# Patient Record
Sex: Female | Born: 1935 | Race: White | Hispanic: No | Smoking: Never smoker
Health system: Southern US, Community
[De-identification: ages and names within clinical notes are randomized; demographics above are authoritative.]

## PROBLEM LIST (undated history)

## (undated) DIAGNOSIS — K219 Gastro-esophageal reflux disease without esophagitis: Secondary | ICD-10-CM

## (undated) DIAGNOSIS — I482 Chronic atrial fibrillation, unspecified: Secondary | ICD-10-CM

## (undated) DIAGNOSIS — E785 Hyperlipidemia, unspecified: Secondary | ICD-10-CM

## (undated) DIAGNOSIS — C55 Malignant neoplasm of uterus, part unspecified: Secondary | ICD-10-CM

## (undated) DIAGNOSIS — H409 Unspecified glaucoma: Secondary | ICD-10-CM

## (undated) DIAGNOSIS — E039 Hypothyroidism, unspecified: Secondary | ICD-10-CM

## (undated) DIAGNOSIS — F418 Other specified anxiety disorders: Secondary | ICD-10-CM

## (undated) DIAGNOSIS — I2510 Atherosclerotic heart disease of native coronary artery without angina pectoris: Secondary | ICD-10-CM

## (undated) HISTORY — DX: Other specified anxiety disorders: F41.8

## (undated) HISTORY — PX: APPENDECTOMY: SHX54

## (undated) HISTORY — DX: Atherosclerotic heart disease of native coronary artery without angina pectoris: I25.10

## (undated) HISTORY — PX: TONSILLECTOMY: SUR1361

## (undated) HISTORY — DX: Malignant neoplasm of uterus, part unspecified: C55

## (undated) HISTORY — DX: Gastro-esophageal reflux disease without esophagitis: K21.9

## (undated) HISTORY — DX: Hypothyroidism, unspecified: E03.9

## (undated) HISTORY — DX: Hyperlipidemia, unspecified: E78.5

## (undated) HISTORY — DX: Chronic atrial fibrillation, unspecified: I48.20

## (undated) HISTORY — DX: Unspecified glaucoma: H40.9

---

## 1995-05-21 HISTORY — PX: BREAST EXCISIONAL BIOPSY: SUR124

## 1998-04-17 ENCOUNTER — Inpatient Hospital Stay (HOSPITAL_COMMUNITY): Admission: EM | Admit: 1998-04-17 | Discharge: 1998-04-19 | Payer: Self-pay | Admitting: Cardiology

## 1998-04-19 DIAGNOSIS — I2510 Atherosclerotic heart disease of native coronary artery without angina pectoris: Secondary | ICD-10-CM

## 2000-07-18 HISTORY — PX: TOTAL ABDOMINAL HYSTERECTOMY W/ BILATERAL SALPINGOOPHORECTOMY: SHX83

## 2000-09-25 ENCOUNTER — Encounter: Payer: Self-pay | Admitting: Internal Medicine

## 2000-09-25 ENCOUNTER — Ambulatory Visit (HOSPITAL_COMMUNITY): Admission: RE | Admit: 2000-09-25 | Discharge: 2000-09-25 | Payer: Self-pay | Admitting: Internal Medicine

## 2000-10-02 ENCOUNTER — Ambulatory Visit (HOSPITAL_COMMUNITY): Admission: RE | Admit: 2000-10-02 | Discharge: 2000-10-02 | Payer: Self-pay | Admitting: Internal Medicine

## 2000-10-02 ENCOUNTER — Encounter: Payer: Self-pay | Admitting: Internal Medicine

## 2001-06-16 ENCOUNTER — Ambulatory Visit (HOSPITAL_COMMUNITY): Admission: RE | Admit: 2001-06-16 | Discharge: 2001-06-16 | Payer: Self-pay | Admitting: Cardiology

## 2001-08-11 ENCOUNTER — Other Ambulatory Visit: Admission: RE | Admit: 2001-08-11 | Discharge: 2001-08-11 | Payer: Self-pay | Admitting: Internal Medicine

## 2001-08-28 ENCOUNTER — Inpatient Hospital Stay (HOSPITAL_COMMUNITY): Admission: EM | Admit: 2001-08-28 | Discharge: 2001-08-31 | Payer: Self-pay | Admitting: Internal Medicine

## 2001-08-28 ENCOUNTER — Encounter: Payer: Self-pay | Admitting: Internal Medicine

## 2001-10-06 ENCOUNTER — Encounter: Payer: Self-pay | Admitting: Internal Medicine

## 2001-10-06 ENCOUNTER — Ambulatory Visit (HOSPITAL_COMMUNITY): Admission: RE | Admit: 2001-10-06 | Discharge: 2001-10-06 | Payer: Self-pay | Admitting: Internal Medicine

## 2002-03-19 ENCOUNTER — Inpatient Hospital Stay (HOSPITAL_COMMUNITY): Admission: EM | Admit: 2002-03-19 | Discharge: 2002-04-01 | Payer: Self-pay | Admitting: Psychiatry

## 2002-10-21 ENCOUNTER — Ambulatory Visit (HOSPITAL_COMMUNITY): Admission: RE | Admit: 2002-10-21 | Discharge: 2002-10-21 | Payer: Self-pay | Admitting: Internal Medicine

## 2002-10-21 ENCOUNTER — Encounter: Payer: Self-pay | Admitting: Internal Medicine

## 2004-01-03 ENCOUNTER — Ambulatory Visit (HOSPITAL_COMMUNITY): Admission: RE | Admit: 2004-01-03 | Discharge: 2004-01-03 | Payer: Self-pay | Admitting: Internal Medicine

## 2004-04-03 ENCOUNTER — Ambulatory Visit: Payer: Self-pay | Admitting: *Deleted

## 2004-05-08 ENCOUNTER — Ambulatory Visit: Payer: Self-pay | Admitting: *Deleted

## 2004-05-22 ENCOUNTER — Ambulatory Visit: Payer: Self-pay | Admitting: Internal Medicine

## 2004-06-01 ENCOUNTER — Ambulatory Visit: Payer: Self-pay | Admitting: *Deleted

## 2004-06-28 ENCOUNTER — Ambulatory Visit: Payer: Self-pay | Admitting: *Deleted

## 2004-07-26 ENCOUNTER — Ambulatory Visit: Payer: Self-pay | Admitting: *Deleted

## 2004-08-23 ENCOUNTER — Ambulatory Visit: Payer: Self-pay | Admitting: Cardiology

## 2004-09-19 ENCOUNTER — Ambulatory Visit: Payer: Self-pay | Admitting: *Deleted

## 2004-10-22 ENCOUNTER — Ambulatory Visit: Payer: Self-pay | Admitting: Cardiology

## 2004-11-19 ENCOUNTER — Ambulatory Visit: Payer: Self-pay | Admitting: Cardiology

## 2004-12-18 ENCOUNTER — Ambulatory Visit: Payer: Self-pay | Admitting: *Deleted

## 2005-01-03 ENCOUNTER — Ambulatory Visit (HOSPITAL_COMMUNITY): Admission: RE | Admit: 2005-01-03 | Discharge: 2005-01-03 | Payer: Self-pay | Admitting: Internal Medicine

## 2005-01-11 ENCOUNTER — Ambulatory Visit: Payer: Self-pay | Admitting: *Deleted

## 2005-01-29 ENCOUNTER — Ambulatory Visit: Payer: Self-pay | Admitting: *Deleted

## 2005-03-07 ENCOUNTER — Ambulatory Visit: Payer: Self-pay | Admitting: Cardiology

## 2005-04-08 ENCOUNTER — Ambulatory Visit: Payer: Self-pay | Admitting: Cardiology

## 2005-05-07 ENCOUNTER — Ambulatory Visit: Payer: Self-pay | Admitting: Cardiology

## 2005-06-24 ENCOUNTER — Ambulatory Visit: Payer: Self-pay | Admitting: *Deleted

## 2005-07-11 ENCOUNTER — Ambulatory Visit: Payer: Self-pay | Admitting: Cardiology

## 2005-08-08 ENCOUNTER — Ambulatory Visit: Payer: Self-pay | Admitting: *Deleted

## 2005-09-12 ENCOUNTER — Ambulatory Visit: Payer: Self-pay | Admitting: Cardiology

## 2005-09-24 ENCOUNTER — Ambulatory Visit: Payer: Self-pay | Admitting: Cardiology

## 2005-10-29 ENCOUNTER — Ambulatory Visit: Payer: Self-pay | Admitting: *Deleted

## 2005-11-27 ENCOUNTER — Ambulatory Visit: Payer: Self-pay | Admitting: Internal Medicine

## 2005-11-27 ENCOUNTER — Ambulatory Visit: Payer: Self-pay | Admitting: Cardiology

## 2005-12-25 ENCOUNTER — Ambulatory Visit: Payer: Self-pay | Admitting: Internal Medicine

## 2005-12-25 ENCOUNTER — Other Ambulatory Visit: Admission: RE | Admit: 2005-12-25 | Discharge: 2005-12-25 | Payer: Self-pay | Admitting: Internal Medicine

## 2005-12-31 ENCOUNTER — Ambulatory Visit: Payer: Self-pay | Admitting: *Deleted

## 2006-01-07 ENCOUNTER — Ambulatory Visit (HOSPITAL_COMMUNITY): Admission: RE | Admit: 2006-01-07 | Discharge: 2006-01-07 | Payer: Self-pay | Admitting: Internal Medicine

## 2006-02-03 ENCOUNTER — Ambulatory Visit: Payer: Self-pay | Admitting: Cardiology

## 2006-03-06 ENCOUNTER — Ambulatory Visit: Payer: Self-pay | Admitting: Cardiology

## 2006-04-08 ENCOUNTER — Ambulatory Visit: Payer: Self-pay | Admitting: Cardiology

## 2006-05-06 ENCOUNTER — Ambulatory Visit: Payer: Self-pay | Admitting: Cardiology

## 2006-05-22 ENCOUNTER — Ambulatory Visit: Payer: Self-pay | Admitting: Cardiology

## 2006-06-24 ENCOUNTER — Ambulatory Visit: Payer: Self-pay | Admitting: Cardiology

## 2006-07-22 ENCOUNTER — Ambulatory Visit: Payer: Self-pay | Admitting: Cardiology

## 2006-08-19 ENCOUNTER — Ambulatory Visit: Payer: Self-pay | Admitting: Cardiovascular Disease

## 2006-09-23 ENCOUNTER — Ambulatory Visit: Payer: Self-pay | Admitting: Cardiology

## 2006-10-21 ENCOUNTER — Ambulatory Visit: Payer: Self-pay | Admitting: Cardiology

## 2006-11-18 ENCOUNTER — Ambulatory Visit: Payer: Self-pay | Admitting: Cardiovascular Disease

## 2006-12-23 ENCOUNTER — Ambulatory Visit: Payer: Self-pay | Admitting: Cardiology

## 2007-01-06 ENCOUNTER — Ambulatory Visit: Payer: Self-pay | Admitting: Cardiovascular Disease

## 2007-01-12 ENCOUNTER — Ambulatory Visit (HOSPITAL_COMMUNITY): Admission: RE | Admit: 2007-01-12 | Discharge: 2007-01-12 | Payer: Self-pay | Admitting: Internal Medicine

## 2007-01-23 ENCOUNTER — Ambulatory Visit (HOSPITAL_COMMUNITY): Admission: RE | Admit: 2007-01-23 | Discharge: 2007-01-23 | Payer: Self-pay | Admitting: Internal Medicine

## 2007-01-23 ENCOUNTER — Ambulatory Visit: Payer: Self-pay | Admitting: Internal Medicine

## 2007-01-23 ENCOUNTER — Encounter: Payer: Self-pay | Admitting: Internal Medicine

## 2007-01-23 HISTORY — PX: COLONOSCOPY: SHX174

## 2007-01-27 ENCOUNTER — Ambulatory Visit: Payer: Self-pay | Admitting: Cardiology

## 2007-02-09 ENCOUNTER — Ambulatory Visit: Payer: Self-pay | Admitting: Cardiology

## 2007-03-10 ENCOUNTER — Ambulatory Visit: Payer: Self-pay | Admitting: Cardiology

## 2007-04-07 ENCOUNTER — Ambulatory Visit: Payer: Self-pay | Admitting: Cardiology

## 2007-05-04 ENCOUNTER — Ambulatory Visit: Payer: Self-pay | Admitting: Cardiology

## 2007-05-25 ENCOUNTER — Ambulatory Visit: Payer: Self-pay | Admitting: Cardiology

## 2007-06-08 ENCOUNTER — Ambulatory Visit: Payer: Self-pay | Admitting: Cardiology

## 2007-06-29 ENCOUNTER — Ambulatory Visit: Payer: Self-pay | Admitting: Internal Medicine

## 2007-07-27 ENCOUNTER — Ambulatory Visit: Payer: Self-pay | Admitting: Internal Medicine

## 2007-08-11 ENCOUNTER — Ambulatory Visit: Payer: Self-pay | Admitting: Cardiology

## 2007-09-07 ENCOUNTER — Ambulatory Visit: Payer: Self-pay | Admitting: Cardiology

## 2007-10-06 ENCOUNTER — Ambulatory Visit: Payer: Self-pay | Admitting: Cardiology

## 2007-11-04 ENCOUNTER — Ambulatory Visit: Payer: Self-pay | Admitting: Cardiology

## 2007-12-03 ENCOUNTER — Ambulatory Visit: Payer: Self-pay | Admitting: Cardiology

## 2007-12-17 ENCOUNTER — Ambulatory Visit: Payer: Self-pay | Admitting: Cardiology

## 2007-12-31 ENCOUNTER — Ambulatory Visit: Payer: Self-pay | Admitting: Cardiology

## 2008-01-07 ENCOUNTER — Ambulatory Visit: Payer: Self-pay | Admitting: Cardiology

## 2008-01-19 ENCOUNTER — Ambulatory Visit (HOSPITAL_COMMUNITY): Admission: RE | Admit: 2008-01-19 | Discharge: 2008-01-19 | Payer: Self-pay | Admitting: Internal Medicine

## 2008-01-21 ENCOUNTER — Ambulatory Visit: Payer: Self-pay | Admitting: Cardiology

## 2008-02-25 ENCOUNTER — Ambulatory Visit: Payer: Self-pay | Admitting: Cardiology

## 2008-03-31 ENCOUNTER — Ambulatory Visit: Payer: Self-pay | Admitting: Cardiology

## 2008-04-28 ENCOUNTER — Ambulatory Visit: Payer: Self-pay | Admitting: Cardiology

## 2008-05-23 ENCOUNTER — Ambulatory Visit: Payer: Self-pay | Admitting: Cardiology

## 2008-06-06 ENCOUNTER — Ambulatory Visit: Payer: Self-pay | Admitting: Cardiology

## 2008-06-13 ENCOUNTER — Ambulatory Visit (HOSPITAL_COMMUNITY): Payer: Self-pay | Admitting: Psychology

## 2008-06-20 ENCOUNTER — Ambulatory Visit: Payer: Self-pay | Admitting: Cardiology

## 2008-07-04 ENCOUNTER — Ambulatory Visit: Payer: Self-pay | Admitting: Cardiology

## 2008-07-28 ENCOUNTER — Ambulatory Visit: Payer: Self-pay | Admitting: Cardiology

## 2008-09-01 ENCOUNTER — Ambulatory Visit: Payer: Self-pay | Admitting: Cardiology

## 2008-09-29 ENCOUNTER — Ambulatory Visit: Payer: Self-pay | Admitting: Cardiology

## 2008-11-03 ENCOUNTER — Ambulatory Visit: Payer: Self-pay | Admitting: Cardiology

## 2008-12-05 ENCOUNTER — Ambulatory Visit: Payer: Self-pay | Admitting: Cardiology

## 2009-01-02 ENCOUNTER — Encounter: Payer: Self-pay | Admitting: *Deleted

## 2009-01-09 ENCOUNTER — Ambulatory Visit: Payer: Self-pay | Admitting: Cardiology

## 2009-01-11 ENCOUNTER — Encounter (INDEPENDENT_AMBULATORY_CARE_PROVIDER_SITE_OTHER): Payer: Self-pay | Admitting: *Deleted

## 2009-01-11 LAB — CONVERTED CEMR LAB
ALT: 15 units/L
AST: 23 units/L
Albumin: 4.1 g/dL
BUN: 16 mg/dL
CO2: 24 meq/L
Calcium: 9.4 mg/dL
Glucose, Bld: 98 mg/dL
MCV: 91.3 fL
Nitrite: NEGATIVE
Platelets: 180 10*3/uL
Sodium: 143 meq/L
Total Protein: 7.1 g/dL
Triglycerides: 214 mg/dL
WBC: 6.2 10*3/uL
pH: 6.5

## 2009-01-16 ENCOUNTER — Encounter: Payer: Self-pay | Admitting: Cardiology

## 2009-01-30 ENCOUNTER — Encounter (INDEPENDENT_AMBULATORY_CARE_PROVIDER_SITE_OTHER): Payer: Self-pay | Admitting: *Deleted

## 2009-01-30 ENCOUNTER — Ambulatory Visit (HOSPITAL_COMMUNITY): Admission: RE | Admit: 2009-01-30 | Discharge: 2009-01-30 | Payer: Self-pay | Admitting: Internal Medicine

## 2009-02-06 ENCOUNTER — Ambulatory Visit: Payer: Self-pay | Admitting: Cardiology

## 2009-02-06 LAB — CONVERTED CEMR LAB: POC INR: 2.6

## 2009-03-06 ENCOUNTER — Ambulatory Visit: Payer: Self-pay | Admitting: Cardiology

## 2009-03-06 LAB — CONVERTED CEMR LAB: POC INR: 3.2

## 2009-04-03 ENCOUNTER — Ambulatory Visit: Payer: Self-pay | Admitting: Cardiology

## 2009-05-01 ENCOUNTER — Ambulatory Visit: Payer: Self-pay | Admitting: Cardiology

## 2009-05-01 LAB — CONVERTED CEMR LAB: POC INR: 2.9

## 2009-05-31 ENCOUNTER — Telehealth (INDEPENDENT_AMBULATORY_CARE_PROVIDER_SITE_OTHER): Payer: Self-pay

## 2009-06-05 ENCOUNTER — Ambulatory Visit: Payer: Self-pay | Admitting: Cardiology

## 2009-07-03 ENCOUNTER — Ambulatory Visit: Payer: Self-pay | Admitting: Cardiology

## 2009-07-03 LAB — CONVERTED CEMR LAB: POC INR: 2.8

## 2009-07-17 ENCOUNTER — Ambulatory Visit: Payer: Self-pay | Admitting: Cardiology

## 2009-07-17 DIAGNOSIS — Z8542 Personal history of malignant neoplasm of other parts of uterus: Secondary | ICD-10-CM

## 2009-07-17 DIAGNOSIS — D126 Benign neoplasm of colon, unspecified: Secondary | ICD-10-CM

## 2009-07-17 DIAGNOSIS — K5730 Diverticulosis of large intestine without perforation or abscess without bleeding: Secondary | ICD-10-CM | POA: Insufficient documentation

## 2009-07-17 DIAGNOSIS — E785 Hyperlipidemia, unspecified: Secondary | ICD-10-CM | POA: Insufficient documentation

## 2009-08-02 ENCOUNTER — Ambulatory Visit: Payer: Self-pay | Admitting: Cardiology

## 2009-09-04 ENCOUNTER — Ambulatory Visit: Payer: Self-pay | Admitting: Cardiovascular Disease

## 2009-09-04 LAB — CONVERTED CEMR LAB: POC INR: 2.6

## 2009-09-20 ENCOUNTER — Encounter (INDEPENDENT_AMBULATORY_CARE_PROVIDER_SITE_OTHER): Payer: Self-pay | Admitting: *Deleted

## 2009-10-02 ENCOUNTER — Ambulatory Visit: Payer: Self-pay | Admitting: Cardiology

## 2009-10-02 LAB — CONVERTED CEMR LAB: POC INR: 2.2

## 2009-11-06 ENCOUNTER — Ambulatory Visit: Payer: Self-pay | Admitting: Cardiovascular Disease

## 2009-11-06 LAB — CONVERTED CEMR LAB: POC INR: 2.1

## 2009-12-11 ENCOUNTER — Ambulatory Visit: Payer: Self-pay | Admitting: Cardiology

## 2010-01-08 ENCOUNTER — Ambulatory Visit: Payer: Self-pay | Admitting: Cardiology

## 2010-01-08 LAB — CONVERTED CEMR LAB: POC INR: 2.3

## 2010-01-31 ENCOUNTER — Encounter (INDEPENDENT_AMBULATORY_CARE_PROVIDER_SITE_OTHER): Payer: Self-pay

## 2010-01-31 LAB — CONVERTED CEMR LAB
Bacteria, UA: NONE SEEN
Chloride: 108 meq/L
Cholesterol: 220 mg/dL
Glucose, Bld: 94 mg/dL
HDL: 42 mg/dL
Hemoglobin: 13.6 g/dL
RBC / HPF: 3.6
TSH: 1.738 microintl units/mL
Urine Glucose: NEGATIVE mg/dL
WBC number, urine, microscopy: 7.1 /hpf
WBC, UA: 7.1 cells/hpf
pH: 5.5

## 2010-02-05 ENCOUNTER — Ambulatory Visit: Payer: Self-pay | Admitting: Cardiology

## 2010-02-05 ENCOUNTER — Ambulatory Visit (HOSPITAL_COMMUNITY): Admission: RE | Admit: 2010-02-05 | Discharge: 2010-02-05 | Payer: Self-pay | Admitting: Internal Medicine

## 2010-02-05 LAB — CONVERTED CEMR LAB: POC INR: 2.8

## 2010-03-05 ENCOUNTER — Ambulatory Visit: Payer: Self-pay | Admitting: Cardiology

## 2010-03-05 LAB — CONVERTED CEMR LAB: POC INR: 2.2

## 2010-04-02 ENCOUNTER — Ambulatory Visit: Payer: Self-pay | Admitting: Cardiology

## 2010-04-02 LAB — CONVERTED CEMR LAB: POC INR: 2.7

## 2010-04-30 ENCOUNTER — Ambulatory Visit: Payer: Self-pay | Admitting: Cardiology

## 2010-04-30 LAB — CONVERTED CEMR LAB: POC INR: 3.8

## 2010-05-28 ENCOUNTER — Ambulatory Visit: Admission: RE | Admit: 2010-05-28 | Discharge: 2010-05-28 | Payer: Self-pay | Source: Home / Self Care

## 2010-06-05 ENCOUNTER — Encounter: Payer: Self-pay | Admitting: Cardiology

## 2010-06-19 NOTE — Medication Information (Signed)
Summary: ccr-lr  Anticoagulant Therapy  Managed by: Vashti Hey, RN Supervising MD: Dietrich Pates MD, Molly Maduro Indication 1: Atrial Fibrillation (ICD-427.31) Lab Used: [facility] [place] Site: [place] INR POC 2.8  Dietary changes: no    Health status changes: no    Bleeding/hemorrhagic complications: no    Recent/future hospitalizations: no    Any changes in medication regimen? no    Recent/future dental: no  Any missed doses?: no       Is patient compliant with meds? yes       Anticoagulation Management History:      The patient is taking warfarin and comes in today for a routine follow up visit.  Positive risk factors for bleeding include an age of 75 years or older.  The bleeding index is 'intermediate risk'.  Positive CHADS2 values include Age > 75 years old.  The start date was 09/17/2001.  Anticoagulation responsible provider: Dietrich Pates MD, Molly Maduro.  INR POC: 2.8.  Cuvette Lot#: [id number].  Exp: 10/11.    Anticoagulation Management Assessment/Plan:      The patient's current anticoagulation dose is Coumadin 4 mg tabs: Take as directed by [facility] Clinic..  The target INR is 2 - 3.  The next INR is due 07/31/2009.  Anticoagulation instructions were given to patient.  Results were reviewed/authorized by Vashti Hey, RN.  She was notified by Vashti Hey RN.         Prior Anticoagulation Instructions: INR 2.3 Continue coumadin 6mg  once daily except 4mg  on Mondays, Wednesdays and Friday  Current Anticoagulation Instructions: INR 2.8 Continue coumadin 6mg  once daily except 4mg  on Mondays, Wednesdays and Fridays

## 2010-06-19 NOTE — Medication Information (Signed)
Summary: ccr-lr  Anticoagulant Therapy  Managed by: Vashti Hey, RN PCP: Gala Lewandowsky Scharlene Corn MD: Diona Browner MD, Remi Deter Indication 1: Atrial Fibrillation (ICD-427.31) Lab Used: [facility] [place] Site: [place] INR POC 2.2  Dietary changes: no    Health status changes: no    Bleeding/hemorrhagic complications: no    Recent/future hospitalizations: no    Any changes in medication regimen? no    Recent/future dental: no  Any missed doses?: no       Is patient compliant with meds? yes       Allergies: 1)  ! Codeine  Anticoagulation Management History:      The patient is taking warfarin and comes in today for a routine follow up visit.  Positive risk factors for bleeding include an age of 32 years or older.  The bleeding index is 'intermediate risk'.  Positive CHADS2 values include History of HTN and Age > 33 years old.  The start date was 09/17/2001.  Anticoagulation responsible provider: Diona Browner MD, Remi Deter.  INR POC: 2.2.  Cuvette Lot#: [id number].  Exp: 12/2010.    Anticoagulation Management Assessment/Plan:      The patient's current anticoagulation dose is Coumadin 4 mg tabs: Take as directed by [facility] Clinic..  The target INR is 2 - 3.  The next INR is due 04/02/2010.  Anticoagulation instructions were given to patient.  Results were reviewed/authorized by Vashti Hey, RN.  She was notified by Vashti Hey RN.         Prior Anticoagulation Instructions: INR 2.8 Continue coumadin 6mg  once daily except 4mg  on Mondays, Wednesdays and Fridays  Current Anticoagulation Instructions: INR 2.2 Continue coumadin 6mg  once daily except 4mg  on M,W,F

## 2010-06-19 NOTE — Miscellaneous (Signed)
Summary: metoprolol refill  Clinical Lists Changes  Medications: Changed medication from TOPROL XL 50 MG XR24H-TAB (METOPROLOL SUCCINATE) take 1 1/2 tabs daily to METOPROLOL SUCCINATE 50 MG XR24H-TAB (METOPROLOL SUCCINATE) Take 1  1/2  tablets  by mouth daily - Signed Rx of METOPROLOL SUCCINATE 50 MG XR24H-TAB (METOPROLOL SUCCINATE) Take 1  1/2  tablets  by mouth daily;  #135 x 6;  Signed;  Entered by: Teressa Lower RN;  Authorized by: Kathlen Brunswick, MD, [facility];  Method used: Electronically to [facility] [address] [place], [place], [place]  [address], Ph: [phone], Fax: [phone]    Prescriptions: METOPROLOL SUCCINATE 50 MG XR24H-TAB (METOPROLOL SUCCINATE) Take 1  1/2  tablets  by mouth daily  #135 x 6   Entered by:   Teressa Lower RN   Authorized by:   Kathlen Brunswick, MD, [facility]   Signed by:   Teressa Lower RN on 09/20/2009   Method used:   Electronically to        [facility]  [address] (retail)       [address]       [place], [place]  [address]       Ph: [phone]       Fax: [phone]   RxID:   [id number]

## 2010-06-19 NOTE — Medication Information (Signed)
Summary: ccr-lr  Anticoagulant Therapy  Managed by: Vashti Hey, RN PCP: Osborne Casco Supervising MD: Dietrich Pates MD, Molly Maduro Indication 1: Atrial Fibrillation (ICD-427.31) Lab Used: [facility] [place] Site: [place] INR POC 2.2  Dietary changes: no    Health status changes: no    Bleeding/hemorrhagic complications: no    Recent/future hospitalizations: no    Any changes in medication regimen? no    Recent/future dental: no  Any missed doses?: no       Is patient compliant with meds? yes       Allergies: 1)  ! Codeine  Anticoagulation Management History:      The patient is taking warfarin and comes in today for a routine follow up visit.  Positive risk factors for bleeding include an age of 75 years or older.  The bleeding index is 'intermediate risk'.  Positive CHADS2 values include History of HTN and Age > 75 years old.  The start date was 09/17/2001.  Anticoagulation responsible provider: Dietrich Pates MD, Molly Maduro.  INR POC: 2.2.  Cuvette Lot#: [id number].  Exp: 10/11.    Anticoagulation Management Assessment/Plan:      The patient's current anticoagulation dose is Coumadin 4 mg tabs: Take as directed by [facility] Clinic..  The target INR is 2 - 3.  The next INR is due 10/30/2009.  Anticoagulation instructions were given to patient.  Results were reviewed/authorized by Vashti Hey, RN.  She was notified by Vashti Hey RN.         Prior Anticoagulation Instructions: INR 2.6 Continue coumadin 6mg  once daily except 4mg  on Mondays, Wednesdays and Fridays  Current Anticoagulation Instructions: INR 2.2 Continue coumadin 6mg  once daily except 4mg  on Mondays, Wednesdays and Fridays Prescriptions: COUMADIN 4 MG TABS (WARFARIN SODIUM) Take as directed by Cou[facility]in Clinic.  #135 x 2   Entered by:   Vashti Hey RN   Authorized by:   Kathlen Brunswick, MD, [facility]   Signed by:   Vashti Hey RN on 10/02/2009   Method used:   Electronically to        [facility]  [address]  (retail)       [address]       [place], [place]  [address]       Ph: [phone]       Fax: [phone]   RxID:   [id number]

## 2010-06-19 NOTE — Medication Information (Signed)
Summary: CCR  Anticoagulant Therapy  Managed by: Vashti Hey, RN Supervising MD: Dietrich Pates MD, Molly Maduro Indication 1: Atrial Fibrillation (ICD-427.31) Lab Used: [facility] [place] Site: [place] INR POC 2.3  Dietary changes: no    Health status changes: no    Bleeding/hemorrhagic complications: no    Recent/future hospitalizations: no    Any changes in medication regimen? no    Recent/future dental: no  Any missed doses?: no       Is patient compliant with meds? yes       Anticoagulation Management History:      The patient is taking warfarin and comes in today for a routine follow up visit.  Positive risk factors for bleeding include an age of 86 years or older.  The bleeding index is 'intermediate risk'.  Positive CHADS2 values include Age > 69 years old.  The start date was 09/17/2001.  Anticoagulation responsible provider: Dietrich Pates MD, Molly Maduro.  INR POC: 2.3.  Cuvette Lot#: [id number].  Exp: 10/11.    Anticoagulation Management Assessment/Plan:      The patient's current anticoagulation dose is Coumadin 4 mg tabs: Take as directed by Coumadin Clinic..  The target INR is 2 - 3.  The next INR is due 07/03/2009.  Anticoagulation instructions were given to patient.  Results were reviewed/authorized by Vashti Hey, RN.  She was notified by Vashti Hey RN.         Prior Anticoagulation Instructions: INR 2.9 Continue coumadin 6mg  once daily except 4mg  on Mondays, Wednesdays and Fridays  Current Anticoagulation Instructions: INR 2.3 Continue coumadin 6mg  once daily except 4mg  on Mondays, Wednesdays and Friday

## 2010-06-19 NOTE — Miscellaneous (Signed)
**Note De-Identified  Obfuscation** Summary: UA, CBC, CMP, Lipids, and TSH  Clinical Lists Changes  Observations: Added new observation of LDL: 154 mg/dL (54/00/8676 19:50) Added new observation of HDL: 42 mg/dL (93/26/7124 58:09) Added new observation of TRIGLYC TOT: 119 mg/dL (98/33/8250 53:97) Added new observation of CHOLESTEROL: 220 mg/dL (67/34/1937 90:24) Added new observation of TSH: 1.738 microintl units/mL (01/31/2010 13:25) Added new observation of BACTERIA URN: none seen  (01/31/2010 13:25) Added new observation of RBCS MICRO U: 3.6  (01/31/2010 13:25) Added new observation of WBCS MICRO U: 7.10 cells/hpf (01/31/2010 13:25) Added new observation of CREATININE: 1.02 mg/dL (09/73/5329 92:42) Added new observation of BUN: 23 mg/dL (68/34/1962 22:97) Added new observation of BG RANDOM: 94 mg/dL (98/92/1194 17:40) Added new observation of CO2 PLSM/SER: 24 meq/L (01/31/2010 13:25) Added new observation of CL SERUM: 108 meq/L (01/31/2010 13:25) Added new observation of K SERUM: 3.7 meq/L (01/31/2010 13:25) Added new observation of NA: 141 meq/L (01/31/2010 13:25) Added new observation of PLATELETK/UL: 196 K/uL (01/31/2010 13:25) Added new observation of MCV: 91.7 fL (01/31/2010 13:25) Added new observation of HCT: 39.9 % (01/31/2010 13:25) Added new observation of HGB: 13.6 g/dL (81/44/8185 63:14) Added new observation of WBC COUNT: 6.1 10*3/microliter (01/31/2010 13:25) Added new observation of CASTS URINE: none seen /lpf (01/31/2010 13:25) Added new observation of WBC UR: 7.10 /hpf (01/31/2010 13:25) Added new observation of NITRITE URN: neg  (01/31/2010 13:25) Added new observation of PROTEIN UR: neg mg/dL (97/06/6376 58:85) Added new observation of BLOOD UR: neg  (01/31/2010 13:25) Added new observation of GLUCOSE UA: neg mg/dL (02/77/4128 78:67) Added new observation of PH URINE: 5.5  (01/31/2010 13:25) Added new observation of SPEC GR URIN: 1.018  (01/31/2010 13:25)

## 2010-06-19 NOTE — Medication Information (Signed)
Summary: ccr-lr  Anticoagulant Therapy  Managed by: Vashti Hey, RN PCP: Osborne Casco Supervising MD: Eden Emms MD, Theron Arista Indication 1: Atrial Fibrillation (ICD-427.31) Lab Used: [facility] [place] Site: [place] INR POC 2.6  Dietary changes: no    Health status changes: no    Bleeding/hemorrhagic complications: no    Recent/future hospitalizations: no    Any changes in medication regimen? no    Recent/future dental: no  Any missed doses?: no       Is patient compliant with meds? yes       Allergies: 1)  ! Codeine  Anticoagulation Management History:      The patient is taking warfarin and comes in today for a routine follow up visit.  Positive risk factors for bleeding include an age of 75 years or older.  The bleeding index is 'intermediate risk'.  Positive CHADS2 values include History of HTN and Age > 3 years old.  The start date was 09/17/2001.  Anticoagulation responsible provider: Eden Emms MD, Theron Arista.  INR POC: 2.6.  Cuvette Lot#: [id number].  Exp: 10/11.    Anticoagulation Management Assessment/Plan:      The patient's current anticoagulation dose is Coumadin 4 mg tabs: Take as directed by Coumadin Clinic..  The target INR is 2 - 3.  The next INR is due 10/02/2009.  Anticoagulation instructions were given to patient.  Results were reviewed/authorized by Vashti Hey, RN.  She was notified by Vashti Hey RN.         Prior Anticoagulation Instructions: INR 2.8 Continue coumadin 6mg  once daily except 4mg  on Mondays, Wednesdays and Fridays  Current Anticoagulation Instructions: INR 2.6 Continue coumadin 6mg  once daily except 4mg  on Mondays, Wednesdays and Fridays

## 2010-06-19 NOTE — Medication Information (Signed)
Summary: ccr-lr  Anticoagulant Therapy  Managed by: Weston Brass, PharmD PCP: Osborne Casco Supervising MD: Eden Emms MD, Theron Arista Indication 1: Atrial Fibrillation (ICD-427.31) Lab Used: [facility]  Site: [place] INR POC 2.1  Dietary changes: no    Health status changes: no    Bleeding/hemorrhagic complications: no    Recent/future hospitalizations: no    Any changes in medication regimen? no    Recent/future dental: no  Any missed doses?: no       Is patient compliant with meds? yes       Allergies: 1)  ! Codeine  Anticoagulation Management History:      The patient is taking warfarin and comes in today for a routine follow up visit.  Positive risk factors for bleeding include an age of 75 years or older.  The bleeding index is 'intermediate risk'.  Positive CHADS2 values include History of HTN and Age > 75 years old.  The start date was 09/17/2001.  Anticoagulation responsible provider: Eden Emms MD, Theron Arista.  INR POC: 2.1.  Cuvette Lot#: [id number].  Exp: 12/2010.    Anticoagulation Management Assessment/Plan:      The patient's current anticoagulation dose is Coumadin 4 mg tabs: Take as directed by Coumadin Clinic..  The target INR is 2 - 3.  The next INR is due 12/11/2009.  Anticoagulation instructions were given to patient.  Results were reviewed/authorized by Weston Brass, PharmD.  She was notified by Weston Brass PharmD.         Prior Anticoagulation Instructions: INR 2.2 Continue coumadin 6mg  once daily except 4mg  on Mondays, Wednesdays and Fridays  Current Anticoagulation Instructions: INR 2.1  Continue same dose of 6mg  daily except 4mg  on Monday, Wednesday and Friday.

## 2010-06-19 NOTE — Medication Information (Signed)
Summary: ccr-lr  Anticoagulant Therapy  Managed by: Vashti Hey, RN PCP: Osborne Casco Supervising MD: Daleen Squibb MD, Maisie Fus Indication 1: Atrial Fibrillation (ICD-427.31) Lab Used: [facility] [place] Site: [place] INR POC 2.3  Dietary changes: no    Health status changes: no    Bleeding/hemorrhagic complications: no    Recent/future hospitalizations: no    Any changes in medication regimen? no    Recent/future dental: no  Any missed doses?: no       Is patient compliant with meds? yes       Allergies: 1)  ! Codeine  Anticoagulation Management History:      The patient is taking warfarin and comes in today for a routine follow up visit.  Positive risk factors for bleeding include an age of 45 years or older.  The bleeding index is 'intermediate risk'.  Positive CHADS2 values include History of HTN and Age > 88 years old.  The start date was 09/17/2001.  Anticoagulation responsible provider: Daleen Squibb MD, Maisie Fus.  INR POC: 2.3.  Cuvette Lot#: [id number].  Exp: 12/2010.    Anticoagulation Management Assessment/Plan:      The patient's current anticoagulation dose is Coumadin 4 mg tabs: Take as directed by Coumadin Clinic..  The target INR is 2 - 3.  The next INR is due 02/05/2010.  Anticoagulation instructions were given to patient.  Results were reviewed/authorized by Vashti Hey, RN.  She was notified by Vashti Hey RN.         Prior Anticoagulation Instructions: INR 1.6 Take coumadin 1 extra tablet tonight then resume 1 1/2 tablets once daily except 1 tablet on Mondays, Wednesdays and Fridays  Current Anticoagulation Instructions: INR 2.3 Continue coumadin 6mg  once daily except 4mg  on Mondays, Wednesdays and Fridays

## 2010-06-19 NOTE — Medication Information (Signed)
Summary: ccr-lr  Anticoagulant Therapy  Managed by: Vashti Hey, RN PCP: Osborne Casco Supervising MD: Dietrich Pates MD, Molly Maduro Indication 1: Atrial Fibrillation (ICD-427.31) Lab Used: [facility] [place] Site: [place] INR POC 2.7  Dietary changes: no    Health status changes: no    Bleeding/hemorrhagic complications: no    Recent/future hospitalizations: no    Any changes in medication regimen? no    Recent/future dental: no  Any missed doses?: no       Is patient compliant with meds? yes       Allergies: 1)  ! Codeine  Anticoagulation Management History:      The patient is taking warfarin and comes in today for a routine follow up visit.  Positive risk factors for bleeding include an age of 42 years or older.  The bleeding index is 'intermediate risk'.  Positive CHADS2 values include History of HTN and Age > 81 years old.  The start date was 09/17/2001.  Anticoagulation responsible provider: Dietrich Pates MD, Molly Maduro.  INR POC: 2.7.  Cuvette Lot#: [id number].  Exp: 12/2010.    Anticoagulation Management Assessment/Plan:      The patient's current anticoagulation dose is Coumadin 4 mg tabs: Take as directed by Coumadin Clinic..  The target INR is 2 - 3.  The next INR is due 04/30/2010.  Anticoagulation instructions were given to patient.  Results were reviewed/authorized by Vashti Hey, RN.  She was notified by Vashti Hey RN.         Prior Anticoagulation Instructions: INR 2.2 Continue coumadin 6mg  once daily except 4mg  on M,W,F  Current Anticoagulation Instructions: INR 2.7 Continue coumadin 6mg  once daily except 4mg  on Mondays, Wednesdays and Fridays

## 2010-06-19 NOTE — Progress Notes (Signed)
**Note De-Identified Anadelia Kintz Obfuscation** Summary: needs OV to continue refills  Medications Added COUMADIN 4 MG TABS (WARFARIN SODIUM) Take as directed by Coumadin Clinic.       Phone Note Outgoing Call   Call placed by: Larita Fife Rosalin Buster LPN,  May 31, 2009 3:25 PM Summary of Call: No answer, left message for patient to call office. Also, I spoke with Dominque at [facility]. and asked her to have patient call us. Mrs. Loree has not had an office visit at this office since 06-2005 although she has been coming for Coumadin Clinic she still needs to be seen by MD.  Initial call taken by: Larita Fife Laith Antonelli LPN,  May 31, 2009 3:29 PM  Follow-up for Phone Call        No answer, left message. Follow-up by: Larita Fife Pricella Gaugh LPN,  June 02, 2009 8:24 AM    Additional Follow-up for Phone Call Additional follow up Details #2::    Patient states she will schedule appt. to be seen by Dr. Dietrich Pates when she comes in for Coumadin Clinic today.  Follow-up by: Larita Fife Gonsalo Cuthbertson LPN,  June 05, 2009 9:25 AM  New/Updated Medications: COUMADIN 4 MG TABS (WARFARIN SODIUM) Take as directed by Coumadin Clinic. Prescriptions: COUMADIN 4 MG TABS (WARFARIN SODIUM) Take as directed by Coumadin Clinic.  #135 x 0   Entered by:   Larita Fife Isebella Upshur LPN   Authorized by:   Kathlen Brunswick, MD, [facility]   Signed by:   Larita Fife Colisha Redler LPN on 16/02/9603   Method used:   Electronically to        [facility]  [address] (retail)       [address]       [place], [place]  [address]       Ph: [phone]       Fax: [phone]   RxID:   [id number]

## 2010-06-19 NOTE — Medication Information (Signed)
Summary: rov/sp  Anticoagulant Therapy  Managed by: Vashti Hey, RN PCP: Osborne Casco Supervising MD: Dietrich Pates MD, Molly Maduro Indication 1: Atrial Fibrillation (ICD-427.31) Lab Used: [facility] [place] Site: [place] INR POC 1.6  Dietary changes: no    Health status changes: no    Bleeding/hemorrhagic complications: no    Recent/future hospitalizations: no    Any changes in medication regimen? no    Recent/future dental: no  Any missed doses?: yes     Details: missed 1 dose yesterday  Is patient compliant with meds? yes       Allergies: 1)  ! Codeine  Anticoagulation Management History:      The patient is taking warfarin and comes in today for a routine follow up visit.  Positive risk factors for bleeding include an age of 75 years or older.  The bleeding index is 'intermediate risk'.  Positive CHADS2 values include History of HTN and Age > 33 years old.  The start date was 09/17/2001.  Anticoagulation responsible provider: Dietrich Pates MD, Molly Maduro.  INR POC: 1.6.  Cuvette Lot#: [id number].  Exp: 12/2010.    Anticoagulation Management Assessment/Plan:      The patient's current anticoagulation dose is Coumadin 4 mg tabs: Take as directed by Coumadin Clinic..  The target INR is 2 - 3.  The next INR is due 01/08/2010.  Anticoagulation instructions were given to patient.  Results were reviewed/authorized by Vashti Hey, RN.  She was notified by Vashti Hey RN.         Prior Anticoagulation Instructions: INR 2.1  Continue same dose of 6mg  daily except 4mg  on Monday, Wednesday and Friday.    Current Anticoagulation Instructions: INR 1.6 Take coumadin 1 extra tablet tonight then resume 1 1/2 tablets once daily except 1 tablet on Mondays, Wednesdays and Fridays

## 2010-06-19 NOTE — Medication Information (Signed)
Summary: ccr-lr  Anticoagulant Therapy  Managed by: Vashti Hey, RN PCP: Osborne Casco Supervising MD: Dietrich Pates MD, Molly Maduro Indication 1: Atrial Fibrillation (ICD-427.31) Lab Used: [facility] [place] Site: [place] INR POC 2.8  Dietary changes: no    Health status changes: no    Bleeding/hemorrhagic complications: no    Recent/future hospitalizations: no    Any changes in medication regimen? no    Recent/future dental: no  Any missed doses?: no       Is patient compliant with meds? yes       Allergies: 1)  ! Codeine  Anticoagulation Management History:      The patient is taking warfarin and comes in today for a routine follow up visit.  Positive risk factors for bleeding include an age of 75 years or older.  The bleeding index is 'intermediate risk'.  Positive CHADS2 values include History of HTN and Age > 61 years old.  The start date was 09/17/2001.  Anticoagulation responsible provider: Dietrich Pates MD, Molly Maduro.  INR POC: 2.8.  Cuvette Lot#: [id number].  Exp: 12/2010.    Anticoagulation Management Assessment/Plan:      The patient's current anticoagulation dose is Coumadin 4 mg tabs: Take as directed by Coumadin Clinic..  The target INR is 2 - 3.  The next INR is due 03/05/2010.  Anticoagulation instructions were given to patient.  Results were reviewed/authorized by Vashti Hey, RN.  She was notified by Vashti Hey RN.         Prior Anticoagulation Instructions: INR 2.3 Continue coumadin 6mg  once daily except 4mg  on Mondays, Wednesdays and Fridays  Current Anticoagulation Instructions: INR 2.8 Continue coumadin 6mg  once daily except 4mg  on Mondays, Wednesdays and Fridays

## 2010-06-19 NOTE — Medication Information (Signed)
Summary: PROTIME PER PATIENT CALL/SN  Anticoagulant Therapy  Managed by: Vashti Hey, RN PCP: Osborne Casco Supervising MD: Daleen Squibb MD, Maisie Fus Indication 1: Atrial Fibrillation (ICD-427.31) Lab Used: [facility] [place] Site: [place] INR POC 2.8  Dietary changes: no    Health status changes: no    Bleeding/hemorrhagic complications: no    Recent/future hospitalizations: no    Any changes in medication regimen? no    Recent/future dental: no  Any missed doses?: no       Is patient compliant with meds? yes       Allergies: 1)  ! Codeine  Anticoagulation Management History:      The patient is taking warfarin and comes in today for a routine follow up visit.  Positive risk factors for bleeding include an age of 75 years or older.  The bleeding index is 'intermediate risk'.  Positive CHADS2 values include History of HTN and Age > 75 years old.  The start date was 09/17/2001.  Anticoagulation responsible provider: Daleen Squibb MD, Maisie Fus.  INR POC: 2.8.  Cuvette Lot#: [id number].  Exp: 10/11.    Anticoagulation Management Assessment/Plan:      The patient's current anticoagulation dose is Coumadin 4 mg tabs: Take as directed by Coumadin Clinic..  The target INR is 2 - 3.  The next INR is due 09/04/2009.  Anticoagulation instructions were given to patient.  Results were reviewed/authorized by Vashti Hey, RN.  She was notified by Vashti Hey RN.         Prior Anticoagulation Instructions: INR 2.8 Continue coumadin 6mg  once daily except 4mg  on Mondays, Wednesdays and Fridays  Current Anticoagulation Instructions: Same as Prior Instructions.

## 2010-06-19 NOTE — Assessment & Plan Note (Signed)
Summary: past due for f/u/tg  Medications Added REMERON 15 MG TABS (MIRTAZAPINE) take 1 tab daily      Allergies Added:   Visit Type:  Follow-up Primary Provider:  Osborne Mckinney  CC:  no complaints today.  History of Present Illness: Return visit for this remarkable 77 year old woman, who I unfortunately have not seen for the past 4 years despite her continuing participation in our Coumadin clinic.  Over that interval, she has done extremely well.  She denies all cardiopulmonary symptoms.  She continues to be active including exercising for up to an hour and a half per day and doing all of her housework.  She has not been hospitalized since 2003.  Unfortunately, she does not travel quite as much as she used to.   Blood pressure control has generally been excellent with recent systolics around 100-110.  Total and LDL cholesterol generally have been quite high; however, she has suffered adverse reactions to virtually every lipid-lowering agent.  Laboratory studies were obtained at her last annual physical 6 months ago.  She has had a colonoscopy within the past few years and has periodic Hemoccult stool studies.  Current Medications (verified): 1)  Coumadin 4 Mg Tabs (Warfarin Sodium) .... Take As Directed By Coumadin Clinic. 2)  Synthroid 25 Mcg Tabs (Levothyroxine Sodium) .... Take 1 Tab Daily 3)  Toprol Xl 50 Mg Xr24h-Tab (Metoprolol Succinate) .... Take 1 1/2 Tabs Daily 4)  Fish Oil 1000 Mg Caps (Omega-3 Fatty Acids) .... Take 1 Cap Three Times A Day 5)  Remeron 15 Mg Tabs (Mirtazapine) .... Take 1 Tab Daily  Allergies (verified): 1)  ! Codeine  Past History:  PMH, FH, and Social History reviewed and updated.  Review of Systems       The patient complains of hoarseness.  The patient denies anorexia, fever, weight loss, weight gain, vision loss, decreased hearing, chest pain, syncope, dyspnea on exertion, peripheral edema, prolonged cough, headaches, hemoptysis, abdominal pain,  melena, hematochezia, and severe indigestion/heartburn.    Vital Signs:  Patient profile:   77 year old female Height:      65 inches Weight:      132 pounds Pulse rate:   91 / minute BP sitting:   150 / 86  (right arm)  Vitals Entered By: Katherine Saa, CNA (July 17, 2009 2:24 PM)  Physical Exam  General:    Thin; well developed; no acute distress; voice is deeper and slightly more hoarse than in the past.   Neck-No JVD; no carotid bruits: Lungs-No tachypnea, no rales; no rhonchi; no wheezes: Cardiovascular-normal PMI; normal S1 and S2; S4 present Abdomen-BS normal; soft and non-tender without masses or organomegaly:  Musculoskeletal-No deformities, no cyanosis or clubbing: Neurologic-Normal cranial nerves; symmetric strength and tone:  Skin-Warm, no significant lesions; pallor Extremities-Nl distal pulses; no edema:     Impression & Recommendations:  Problem # 1:  ATRIAL FIBRILLATION, CHRONIC, HX OF (ICD-V12.59) Heart rate control is good.  Current medication will be continued.  Problem # 2:  ANTICOAGULATION (ICD-V58.61)  CBC is normal.  We will continue to adjust warfarin dosage as necessary.  Problem # 3:  HYPERTENSION, BENIGN (ICD-401.1) Blood pressure control has been excellent when assessed in recent years.  Although systolic is slightly elevated today, patient attributes this to stress related to a daughter driving in bad weather.  Her current medical regimen appears appropriate.  Problem # 4:  HYPERLIPIDEMIA (ICD-272.4) Records obtained from Dr. Ouida Sills and reviewed.  Most recent LDL was 165, which  is somewhat improved over baseline and which does not mandate pharmacologic therapy, even if we had therapy available for her.  I doubt that institution of pharmacologic therapy for this problem will ever be appropriate.  Due to the patient's slight hoarseness, I suggested that she discuss possible evaluation with an ENT physician with you at her next office visit.  I  will hope to reassess this nice woman in one year.  Patient Instructions: 1)  Your physician recommends that you schedule a follow-up appointment in: 1 year

## 2010-06-21 NOTE — Medication Information (Signed)
Summary: ccr-lr  Anticoagulant Therapy  Managed by: Vashti Hey, RN PCP: Osborne Casco Supervising MD: Daleen Squibb MD, Maisie Fus Indication 1: Atrial Fibrillation (ICD-427.31) Lab Used: [facility] [place] Site:  INR POC 3.8  Dietary changes: no    Health status changes: no    Bleeding/hemorrhagic complications: no    Recent/future hospitalizations: no    Any changes in medication regimen? no    Recent/future dental: no  Any missed doses?: no       Is patient compliant with meds? yes       Allergies: 1)  ! Codeine  Anticoagulation Management History:      The patient is taking warfarin and comes in today for a routine follow up visit.  Positive risk factors for bleeding include an age of 75 years or older.  The bleeding index is 'intermediate risk'.  Positive CHADS2 values include History of HTN and Age > 75 years old.  The start date was 09/17/2001.  Anticoagulation responsible Ladavia Lindenbaum: Daleen Squibb MD, Maisie Fus.  INR POC: 3.8.  Cuvette Lot#: [id number].  Exp: 12/2010.    Anticoagulation Management Assessment/Plan:      The patient's current anticoagulation dose is Coumadin 4 mg tabs: Take as directed by Coumadin Clinic..  The target INR is 2 - 3.  The next INR is due 05/28/2010.  Anticoagulation instructions were given to patient.  Results were reviewed/authorized by Vashti Hey, RN.  She was notified by Vashti Hey RN.         Prior Anticoagulation Instructions: INR 2.7 Continue coumadin 6mg  once daily except 4mg  on Mondays, Wednesdays and Fridays  Current Anticoagulation Instructions: INR 3.8 Hold coumadin tonight then resume 6mg  once daily except 4mg  on Mondays, Wednesdays and Fridays Increase greens/salads

## 2010-06-21 NOTE — Medication Information (Signed)
Summary: ccr-lr  Anticoagulant Therapy  Managed by: Vashti Hey, RN PCP: Osborne Casco Supervising MD: Dietrich Pates MD, Molly Maduro Indication 1: Atrial Fibrillation (ICD-427.31) Lab Used: [facility]  Site: [place] INR POC 2.4  Dietary changes: no    Health status changes: no    Bleeding/hemorrhagic complications: no    Recent/future hospitalizations: no    Any changes in medication regimen? no    Recent/future dental: no  Any missed doses?: no       Is patient compliant with meds? yes       Allergies: 1)  ! Codeine  Anticoagulation Management History:      The patient is taking warfarin and comes in today for a routine follow up visit.  Positive risk factors for bleeding include an age of 75 years or older.  The bleeding index is 'intermediate risk'.  Positive CHADS2 values include History of HTN and Age > 93 years old.  The start date was 09/17/2001.  Anticoagulation responsible provider: Dietrich Pates MD, Molly Maduro.  INR POC: 2.4.  Cuvette Lot#: [id number].  Exp: 12/2010.    Anticoagulation Management Assessment/Plan:      The patient's current anticoagulation dose is Coumadin 4 mg tabs: Take as directed by Coumadin Clinic..  The target INR is 2 - 3.  The next INR is due 06/25/2010.  Anticoagulation instructions were given to patient.  Results were reviewed/authorized by Vashti Hey, RN.  She was notified by Vashti Hey RN.         Prior Anticoagulation Instructions: INR 3.8 Hold coumadin tonight then resume 6mg  once daily except 4mg  on Mondays, Wednesdays and Fridays Increase greens/salads  Current Anticoagulation Instructions: INR 2.4 Continue coumadin 6mg  once daily except 4mg  on Mondays,  Wednesdays and Fridays

## 2010-06-25 ENCOUNTER — Encounter: Payer: Self-pay | Admitting: Cardiology

## 2010-06-25 ENCOUNTER — Encounter (INDEPENDENT_AMBULATORY_CARE_PROVIDER_SITE_OTHER): Payer: Medicare Other

## 2010-06-25 DIAGNOSIS — I4891 Unspecified atrial fibrillation: Secondary | ICD-10-CM

## 2010-06-25 DIAGNOSIS — Z7901 Long term (current) use of anticoagulants: Secondary | ICD-10-CM

## 2010-06-25 LAB — CONVERTED CEMR LAB: POC INR: 2

## 2010-07-05 NOTE — Medication Information (Signed)
Summary: ccr-lr [place]  Anticoagulant Therapy Managed by: Vashti Hey, RN Patient Assessment Part 2:  Have you MISSED ANY DOSES or CHANGED TABLETS?  0  Have you had any BRUISING or BLEEDING ( nose or gum bleeds,blood in urine or stool)?  Have you STARTED or STOPPED any MEDICATIONS, including OTC meds,herbals or supplements?  Have you CHANGED your DIET, especially green vegetables,or ALCOHOL intake?  Have you had any ILLNESSES or HOSPITALIZATIONS?  Have you had any signs of CLOTTING?(chest discomfort,dizziness,shortness of breath,arms tingling,slurred speech,swelling or redness in leg)       Regimen Out:    Total Weekly: 36 mg mg  Next INR Due: 07/30/2010      Allergies: 1)  ! Codeine  Anticoagulant Therapy  Managed by: Vashti Hey, RN PCP: Osborne Casco Supervising MD: Dietrich Pates MD, Molly Maduro Indication 1: Atrial Fibrillation (ICD-427.31) Lab Used: [facility]  Site: [place] INR POC 2.0  Dietary changes: no    Health status changes: no    Bleeding/hemorrhagic complications: no    Recent/future hospitalizations: no    Any changes in medication regimen? no    Recent/future dental: no  Any missed doses?: no       Is patient compliant with meds? yes         Anticoagulation Management History:      The patient is taking warfarin and comes in today for a routine follow up visit.  Positive risk factors for bleeding include an age of 2 years or older.  The bleeding index is 'intermediate risk'.  Positive CHADS2 values include History of HTN and Age > 68 years old.  The start date was 09/17/2001.  Anticoagulation responsible provider: Dietrich Pates MD, Molly Maduro.  INR POC: 2.0.  Cuvette Lot#: [id number].  Exp: 12/2010.    Anticoagulation Management Assessment/Plan:      The patient's current anticoagulation dose is Coumadin 4 mg tabs: Take as directed by Coumadin Clinic..  The target INR is 2 - 3.  The next INR is due 07/30/2010.   Anticoagulation instructions were given to patient.  Results were reviewed/authorized by Vashti Hey, RN.  She was notified by Vashti Hey RN.         Prior Anticoagulation Instructions: INR 2.4 Continue coumadin 6mg  once daily except 4mg  on Mondays,  Wednesdays and Fridays  Current Anticoagulation Instructions: INR 2.0 Take coumadin extra 1/2 tablet tonight then resume 1 1/2 tablets once daily except 1 tablet on Mondays, Wednesdays and Fridays

## 2010-07-30 ENCOUNTER — Encounter: Payer: Self-pay | Admitting: Cardiology

## 2010-07-30 ENCOUNTER — Encounter (INDEPENDENT_AMBULATORY_CARE_PROVIDER_SITE_OTHER): Payer: Medicare Other

## 2010-07-30 ENCOUNTER — Ambulatory Visit (INDEPENDENT_AMBULATORY_CARE_PROVIDER_SITE_OTHER): Payer: Medicare Other | Admitting: Cardiology

## 2010-07-30 DIAGNOSIS — I4891 Unspecified atrial fibrillation: Secondary | ICD-10-CM

## 2010-07-30 DIAGNOSIS — Z7901 Long term (current) use of anticoagulants: Secondary | ICD-10-CM

## 2010-07-30 LAB — CONVERTED CEMR LAB
Alkaline Phosphatase: 52 units/L
Blood, UA: NEGATIVE
Calcium: 9 mg/dL
Chloride: 108 meq/L
Creatinine, Ser: 1.02 mg/dL
HCT: 39.9 %
LDL Cholesterol: 154 mg/dL
Platelets: 196 10*3/uL
Potassium: 3.7 meq/L
Protein, ur: NEGATIVE mg/dL
Specific Gravity, Urine: 1.018
Total Protein: 6.5 g/dL
pH: 5.5

## 2010-08-07 NOTE — Medication Information (Signed)
Summary: ccr-lr  Anticoagulant Therapy  Managed by: Vashti Hey, RN PCP: Osborne Casco Supervising MD: Dietrich Pates MD, Molly Maduro Indication 1: Atrial Fibrillation (ICD-427.31) Lab Used: [facility] [place] Site: [place]  Dietary changes: no    Health status changes: no    Bleeding/hemorrhagic complications: no    Recent/future hospitalizations: no    Any changes in medication regimen? no    Recent/future dental: no  Any missed doses?: no       Is patient compliant with meds? yes       Allergies: 1)  ! Codeine  Anticoagulation Management History:      The patient is taking warfarin and comes in today for a routine follow up visit.  Positive risk factors for bleeding include an age of 75 years or older.  The bleeding index is 'intermediate risk'.  Positive CHADS2 values include History of HTN and Age > 41 years old.  The start date was 09/17/2001.  Anticoagulation responsible provider: Dietrich Pates MD, Molly Maduro.  Cuvette Lot#: [id number].  Exp: 12/2010.    Anticoagulation Management Assessment/Plan:      The patient's current anticoagulation dose is Coumadin 4 mg tabs: Take as directed by Coumadin Clinic..  The target INR is 2 - 3.  The next INR is due 08/27/2010.  Anticoagulation instructions were given to patient.  Results were reviewed/authorized by Vashti Hey, RN.  She was notified by Vashti Hey RN.         Prior Anticoagulation Instructions: INR 2.0 Take coumadin extra 1/2 tablet tonight then resume 1 1/2 tablets once daily except 1 tablet on Mondays, Wednesdays and Fridays  Current Anticoagulation Instructions: INR 2.5 Continue coumadin 6mg  once daily except 4mg  on Mondays, Wednesdays and Fridays

## 2010-08-15 ENCOUNTER — Other Ambulatory Visit: Payer: Self-pay | Admitting: Cardiology

## 2010-08-16 NOTE — Assessment & Plan Note (Signed)
Summary: 1 YEAR F/U PER CHECKOUT ON 07/17/09/TG, SCH PER TP WALK IN/TMJ/TR  Medications Added METOPROLOL SUCCINATE 50 MG XR24H-TAB (METOPROLOL SUCCINATE) Take 1  1/2  tablets  by mouth daily OCUVITE  TABS (MULTIPLE VITAMINS-MINERALS) take 1 tab daily DAILY MULTI  TABS (MULTIPLE VITAMINS-MINERALS) take 1 tab daily      Allergies Added:   Visit Type:  Follow-up Primary Provider:  Osborne Casco   History of Present Illness: Scheduled return visit for this delightful octogenarian with superb performance status followed for atrial fibrillation.  Patient denies all cardiopulmonary symptoms at present.  She remains active, but notes somewhat increased fatigue in recent years.  She has had no problems with chronic anticoagulation.  She exercises 1.5 hours per day, 3 days per week.  Current Medications (verified): 1)  Coumadin 4 Mg Tabs (Warfarin Sodium) .... Take As Directed By Coumadin Clinic. 2)  Synthroid 25 Mcg Tabs (Levothyroxine Sodium) .... Take 1 Tab Daily 3)  Metoprolol Succinate 50 Mg Xr24h-Tab (Metoprolol Succinate) .... Take 1  1/2  Tablets  By Mouth Daily 4)  Fish Oil 1000 Mg Caps (Omega-3 Fatty Acids) .... Take 1 Cap Three Times A Day 5)  Remeron 15 Mg Tabs (Mirtazapine) .... Take 1 Tab Daily 6)  Ocuvite  Tabs (Multiple Vitamins-Minerals) .... Take 1 Tab Daily 7)  Daily Multi  Tabs (Multiple Vitamins-Minerals) .... Take 1 Tab Daily  Allergies (verified): 1)  ! Codeine  Comments:  Nurse/Medical Assistant: patient didnt bring meds or list we reviewed from previous ov pharmacy is [facility] in [place]  Past History:  PMH, FH, and Social History reviewed and updated.  Review of Systems  The patient denies hoarseness, chest pain, syncope, dyspnea on exertion, peripheral edema, and prolonged cough.    Vital Signs:  Patient profile:   77 year old female Weight:      133 pounds BMI:     22.21 Pulse rate:   85 / minute BP sitting:   149 / 86  (left arm)  Vitals Entered  By: Dreama Saa, CNA (July 30, 2010 2:23 PM)  Physical Exam  General:    Thin; well developed; no acute distress Neck-No JVD; no carotid bruits: Lungs-No tachypnea, no rales; no rhonchi; no wheezes: Cardiovascular-normal PMI; normal S1 and S2; S4 present Abdomen-BS normal; soft and non-tender without masses or organomegaly:  Musculoskeletal-No deformities, no cyanosis or clubbing: Neurologic-Normal cranial nerves; symmetric strength and tone:  Skin-Warm, no significant lesions; pallor Extremities-Nl distal pulses; no edema:     Impression & Recommendations:  Problem # 1:  HYPERTENSION (ICD-401.1) Blood pressure control remains slightly suboptimal on repeated in office measurements; however, patient reports absolutely normal blood pressures when measured at home or in Dr. Alonza Smoker office.  Current values are acceptable under the circumstances.  Problem # 2:  ATRIAL FIBRILLATION, CHRONIC, HX OF (ICD-V12.59) Control of heart rate is excellent with current medications, which will be continued.  Problem # 3:  ANTICOAGULATION (ICD-V58.61) INRs have been stable and therapeutic.  We will seek the results of stool Hemoccult tests and a CBC performed and Dr. Alonza Smoker office.  Patient Instructions: 1)  Your physician recommends that you schedule a follow-up appointment in: 1 year

## 2010-08-16 NOTE — Telephone Encounter (Signed)
Babb °

## 2010-08-17 ENCOUNTER — Encounter: Payer: Self-pay | Admitting: Cardiology

## 2010-08-17 DIAGNOSIS — I4821 Permanent atrial fibrillation: Secondary | ICD-10-CM | POA: Insufficient documentation

## 2010-08-17 DIAGNOSIS — I4891 Unspecified atrial fibrillation: Secondary | ICD-10-CM

## 2010-08-17 DIAGNOSIS — Z7901 Long term (current) use of anticoagulants: Secondary | ICD-10-CM

## 2010-08-17 DIAGNOSIS — Z8679 Personal history of other diseases of the circulatory system: Secondary | ICD-10-CM

## 2010-08-27 ENCOUNTER — Ambulatory Visit (INDEPENDENT_AMBULATORY_CARE_PROVIDER_SITE_OTHER): Payer: Medicare Other | Admitting: *Deleted

## 2010-08-27 DIAGNOSIS — Z7901 Long term (current) use of anticoagulants: Secondary | ICD-10-CM

## 2010-08-27 DIAGNOSIS — I4891 Unspecified atrial fibrillation: Secondary | ICD-10-CM

## 2010-08-27 DIAGNOSIS — Z8679 Personal history of other diseases of the circulatory system: Secondary | ICD-10-CM

## 2010-08-27 LAB — POCT INR: INR: 2.5

## 2010-09-24 ENCOUNTER — Ambulatory Visit (INDEPENDENT_AMBULATORY_CARE_PROVIDER_SITE_OTHER): Payer: Medicare Other | Admitting: *Deleted

## 2010-09-24 DIAGNOSIS — Z7901 Long term (current) use of anticoagulants: Secondary | ICD-10-CM

## 2010-09-24 DIAGNOSIS — Z8679 Personal history of other diseases of the circulatory system: Secondary | ICD-10-CM

## 2010-09-24 DIAGNOSIS — I4891 Unspecified atrial fibrillation: Secondary | ICD-10-CM

## 2010-10-02 NOTE — Op Note (Signed)
NAME:  Katherine Mckinney, Katherine Mckinney                  ACCOUNT NO.:  [id number]   MEDICAL RECORD NO.:  [id number]          PATIENT TYPE:  AMB   LOCATION:  DAY                           FACILITY:  [facility]   PHYSICIAN:  R. Roetta Sessions, M.D. DATE OF BIRTH:  1924-08-06   DATE OF PROCEDURE:  01/23/2007  DATE OF DISCHARGE:                               OPERATIVE REPORT   PROCEDURE:  Colonoscopy with biopsy.   INDICATIONS FOR PROCEDURE:  An 77 year old lady with no lower GI tract  symptoms who has never had a colonoscopy is now referred by Dr. Ouida Sills  for her first ever screening colonoscopy.  She has no lower GI tract  symptoms.  There is no family history of colorectal neoplasia.  Colonoscopy is now being done as a screening maneuver.  This approach  has been discussed with the patient at length.  Potential risks,  benefits, and have been reviewed.  She has been off her Coumadin for 4  days.  Colonoscopy is now being done as a screening maneuver.  Please  see documentation in the medical record.   PROCEDURE NOTE:  O2 saturation, blood pressure, pulse, and respirations  were monitor throughout the entire procedure.   CONSCIOUS SEDATION:  Versed 3 mg, IV Demerol 25 mg IV in divided doses.   INSTRUMENT:  Pentax video chip system.   FINDINGS:  Digital rectal exam revealed no abnormalities.   ENDOSCOPIC FINDINGS:  The prep was excellent.   COLON:  The colonic mucosa was surveyed from the rectosigmoid junction  through the left transverse and right colon to the area appendiceal  orifice, ileocecal valve, and cecum.  These structures were well seen  and photographed for the record.  From this level the scope was slowly  withdrawn.  All previously mentioned mucosal surfaces were, again, seen.  The patient had scattered left-sided diverticula.  The remainder of the  colonic appeared entirely normal.  The scope was pulled down to the  rectum.   A thorough examination of the rectal mucosa retroflex including a  reflex  view of the anal verge demonstrated only a single 4-mm diminutive polyp.  It was cold biopsy/removed.  The patient tolerated the procedure very  well; and was reacted in endoscopy.   IMPRESSION:  1. Diminutive rectal polyp status post cold biopsy removal, otherwise      normal rectum.  2. Scattered sigmoid diverticula, the remainder of the colonic mucosa      appeared normal.   RECOMMENDATIONS:  1. Follow up on path.  2. Diverticulosis literature provided Ms. Haft.  3. I have instructed her to resume her Coumadin, today.      Jonathon Bellows, M.D.  Electronically Signed     RMR/MEDQ  D:  01/23/2007  T:  01/23/2007  Job:  [id number]   cc:   Gerrit Friends. Dietrich Pates, MD, [facility]  [address]  [place], [place] [address]   Kingsley Callander. Ouida Sills, MD  Fax: [phone]

## 2010-10-05 NOTE — Discharge Summary (Signed)
[facility]  Patient:    Katherine Mckinney, Katherine Mckinney Visit Number: [id number] MRN: [id number]          Service Type: MED Location: 2A A228 01 Attending Physician:  Teena Dunk Hor Dictated by:   Renne Musca, M.D. Admit Date:  08/28/2001 Discharge Date: 08/31/2001   CC:         Hamilton Bing, M.D.  Arna Snipe, M.D.   Discharge Summary  DIAGNOSES:  1. Probable transient ischemic attack.  2. Atrial fibrillation with rapid ventricular rate.  3. Normal echocardiogram.  4. History of endometrial adenocarcinoma status post total abdominal     hysterectomy with bilateral salpingo-oophorectomy May 2002.  5. Atypical chest pain.  6. Glaucoma status post surgery.  7. Hypertension.  8. History of appendectomy.  9. History of tonsillectomy. 10. History of breast cyst excision.  MEDICATIONS:  None.  DISPOSITION:  Patient discharged to home.  FOLLOWUP:  In prothrombin clinic on September 02, 2001.  Follow up with Dr. Dietrich Pates in three to four weeks.  Follow up with Dr. Leona Carry as scheduled.  HOSPITAL COURSE:  Patient is a 77 year old white female with a history of atrial fibrillation on aspirin and hypertension on hydrochlorothiazide who presented with intermittent episodes of chest pressure and headache.  She also noted some blurred vision of the left eye which lasted for a short time and resolved.  She also noted some dizziness.  Because of these symptoms she came to the emergency room and was admitted.  A CT scan of the brain revealed an old lacunar infarct, but no acute changes.  Her initial heart rates were in the low 100s.  She was admitted to the hospital for further management.  Her Toprol was increased to 200 mg daily.  She stated this made her feel weak and tired and her heart rates remained in the 70-80 range.  Toprol was then decreased back to 75 at her insistence and heart rates again remained controlled without change.  Her TSH was 9.7 and  Synthroid was instituted at 50 mcg daily.  She tolerated this well.  Coumadin was also instituted with Coumadin teaching per the pharmacy.  Her INR at the time of discharge was 2.8 with a follow-up scheduled for September 02, 2001 in the cardiology clinic.  The patient was instructed to follow up with Dr. Leona Carry in six weeks for TSH, T4 and to continue her multivitamin and calcium supplement as before.  Patient had good understanding of the issues at hand and was discharged to home in stable condition.  PHYSICAL EXAMINATION  GENERAL:  Well-developed, well-nourished female in no distress.  LUNGS:  Clear to auscultation.  NECK:  Supple without JVD or bruits.  HEART:  Irregularly irregular.  Rate is controlled at 74.  ABDOMEN:  Flat, soft, nontender.  EXTREMITIES:  Without clubbing, cyanosis, edema.  VITAL SIGNS:  Blood pressure 100/75 and afebrile. Dictated by:   Renne Musca, M.D. Attending Physician:  Teena Dunk [facility] DD:  09/30/01 TD:  10/02/01 Job: [id number]

## 2010-10-05 NOTE — H&P (Signed)
NAME:  Katherine Mckinney, Katherine Mckinney                            ACCOUNT NO.:  [id number]   MEDICAL RECORD NO.:  [id number]                   PATIENT TYPE:  IPS   LOCATION:  0502                                 [facility]ACILITY:  [facility]   PHYSICIAN:  Christopher B. Quintella Reichert, M.D.          DATE OF BIRTH:  [birth year]   DATE OF ADMISSION:  03/19/2002  DATE OF DISCHARGE:                         PSYCHIATRIC ADMISSION ASSESSMENT   DATE OF INTERVIEW:  March 20, 2002, at 10 a.m.   CHIEF COMPLAINT:  My mind isn't working right.   JUSTIFICATION FOR HOSPITALIZATION:  The patient has severe depression and  suicidal intent.   PATIENT IDENTIFICATION:  This is a 77 year old widowed white female.  Behavior and attitude: She was cooperative and interview was limited by her  poor concentration.   HISTORY OF PRESENT ILLNESS:  This is a 77 year old woman with onset of a  major depressive episode after the death of her husband from a stroke in  September 2003.  Prior to that, she had poor sleep and was worried about her  husband's health.  Since his death, she has felt continuously sad,  depressed, anxious, worried about multiple everyday things in life, poor  sleep, social withdrawal, frequent crying, poor memory and concentration,  decreased appetite.  Her symptoms have been worsening and caused concern in  her daughter who is an R.N.  Her daughter gave her mother samples of Zoloft  two weeks ago and also gave her Klonopin two weeks ago.  She asked her  daughter for medications to take her life so that she could join her  husband.  She has not had hallucinations or psychotic experiences related to  her husband.   She reports side effects from medication of dizziness and feeling unsteady  on her feet.  She is very fearful about taking medications but not paranoid.   PAST PSYCHIATRIC HISTORY:  She denies history of depression or suicide or  past psychiatric treatment.   SUBSTANCE ABUSE HISTORY:  The patient denies.   PAST  MEDICAL HISTORY:  1. Hypothyroidism, last evaluated in March 2003.  2. Atrial fibrillation, on Coumadin and Toprol.  3. Status post hysterectomy.   MEDICATIONS:  1. Zoloft 50 mg p.o. q.d. x 2 weeks.  2. Klonopin 0.5 mg p.o. b.i.d. x 2 weeks.  3. Coumadin 4 mg p.o. q.d.  4. Synthroid 0.05 mg p.o. q.d.  5. Toprol 75 mg p.o. q.h.s.   She has also tried Ambien and Valium recently but did not like their  effects.   ALLERGIES:  DARVOCET.   SOCIAL HISTORY:  She lives alone and reports multiple problems with her  finances and her housing.  She has two children and four grandchildren.  She  was married for most of her life and very close to her husband.  She has  financial difficulty and is very worried about affording her medications.   FAMILY PSYCHIATRIC HISTORY:  Father had complicated grief  reaction after  mother died.   PHYSICAL EXAMINATION:  She reports dizziness.  Her gait was slightly  unsteady.  No Parkinson features.  No tremor.  Finger-to-nose: Within normal  limits.  No ataxia or aphasia.  Pulse 82 with slight irregularities.  Blood  pressure 100/77.  Afebrile.   MENTAL STATUS EXAM:  Thin woman who was very emotionally preoccupied with  poor concentration, which made the interview limited.  She was alert and  oriented x 3.  She had 3/3 recall initially and 2/3 at three minutes.  President: Danae [place].  Previous President: Did not know.  100-7=93.  93-7= did  not know.  She was restless and pacing at times.  Affect: Intense with  furrowed brow and frequent crying.  Mood: Depressed.  Speech: Emotional.  Thought process: Ruminative with decreased concentration.  No paranoia or  delusions or hallucinations.  She denied suicidal ideation but frequently  mentioned that she would not know how to do it, indicating that it has been  on her mind and that she has been thinking of how to do it.  She has no  homicidal ideation.   DIAGNOSTIC IMPRESSION:  A 77 year old woman with an episode  of major  depressive disorder, whose severity is beyond that of complicated grief.  These symptoms have affected her ability to care for herself, to think and  concentrate, and have led to suicidal ideation.  She has no psychotic  symptoms.   ADMISSION DIAGNOSES:   AXIS I:  Major depressive episode, severe without psychotic symptoms.   AXIS II:  Deferred.   AXIS III:  1. Atrial fibrillation.  2. Hypothyroidism.   AXIS IV:  Problems with housing, finances, and grief.   AXIS V:  Current 50%, past year 75%.   ASSETS AND STRENGTHS:  Her primary strength is the acute nature of the  illness and its relationship to stress and her history of good functioning  prior.   INITIAL PLAN OF CARE:  Problem #1. Depression.  Given her difficulty with  affording medication, I will switch Zoloft to Prozac.  This is also likely  to have benefit with her potential for poor compliance given its long half  life.  Given her excessive concern about side effects, I will start at a low  dose.   Problem #2. Anxiety and insomnia.  This woman is at increased risk for fall  so benzodiazepines should be avoided, if possible, during the daytime.  She  did not want to try Ambien or Sonata at night.  Klonopin has the potential  to accumulate in her because of its metabolism.  For this reason, I will use  Ativan p.r.n. for insomnia at night for short-term usage.   Problem #3. Medical problems.  We will evaluate her thyroid and follow her  INR closely.  We will be in contact with her primary care physician to  assess the best INR for her.  Given her medical condition, an INR of 2-3 is  most appropriate.   ESTIMATED LENGTH OF STAY:  Seven to 14 days.   CONDITIONS NECESSARY FOR DISCHARGE:  Response of major depressive episode to  treatment.  Reduction of suicidality.                                                Christopher B. Quintella Reichert, M.D.   CBA/MEDQ  D:  03/20/2002  T:  03/21/2002  Job:  [id number]

## 2010-10-05 NOTE — H&P (Signed)
[facility]  Patient:    Katherine Mckinney, Katherine Mckinney Visit Number: [id number] MRN: [id number]          Service Type: Attending:  Margaretann Loveless, M.D. Dictated by:   Margaretann Loveless, M.D. Adm. Date:  08/28/01   CC:         Arna Snipe, M.D.  [place] Bing, M.D.   History and Physical  CHIEF COMPLAINT:  "Ive been having episodes of dizziness and chest pain over the last two days."  "I had blurred vision in my left eye today for about 15 minutes."  HISTORY OF PRESENT ILLNESS:  Katherine Mckinney is a 77 year old white female, with a past medical history significant for chronic atrial fibrillation, history of uterine cancer - status post hysterectomy, history of glaucoma - status post surgery.  She is followed by Dr. Dietrich Pates for her cardiology care and Dr. Leona Carry for OB/GYN and other care.  The patient states that she had noted onset of "spells" for the last two days and today.  Apparently on Sunday and yesterday she had two distinct episodes of dizziness and headache and chest pressure, which she felt was indigestion. She states that these episodes lasted approximately two to three hours each, and resolved with lying down.  She apparently was vacuuming prior to one of the episodes but not doing any strenuous activity with the other episodes. Today she walked approximately one mile and around 12 noon or shortly afterward she had onset of a headache.  She says it was in the occipital area of her head.  She also had acute onset of what she described as left eye blurred vision.  This blurred vision lasted for approximately 15 minutes and resolved.  She also had recurrent dizziness and today she also has some neck and left shoulder pain.  She described the associated chest pressure again in the substernal area, which again she felt was indigestion.  Her symptoms lasted for approximately one to two hours today except for the left eye blurred vision which lasted  approximately 15 minutes.  All of her symptoms are now resolved.  Because of her acute onset of left eye blurred vision and other symptoms she came to the emergency room for evaluation.  In the ER she had a head CT which showed old lacunar infarct but no acute changes.  Her EKG showed atrial fibrillation with a heart rate of 101.  There was no ischemic ST-T wave changes.  Her initial cardiac enzymes were normal.  Because of the above symptoms it and probable TIA and rapid atrial fibrillation, it was felt admission was indicated.  REVIEW OF SYSTEMS:  She has had headache as described above in the occipital area of her head, but denies any headache now.  She has had acute onset of left eye blurred vision, which has resolved.  She has no other complaints of decreased vision.  No sore mouth, no sore throat.  No problems with talking or swallowing.  No complaints or any pains now in her neck or shoulder area.  No chest pain, tightness, or pressure now.  She had no shortness of breath and no wheezing.  No significant dyspnea on exertion.  No abdominal pain.  No significant constipation or diarrhea.  No hematuria, dysuria, melena, or bright red blood per rectum.  No complaints of pain now in her extremities of joints, but she does have pain in her hands and knees and hips at times.  No fever or chills.  No nausea or  vomiting.  No weight loss.  PAST MEDICAL HISTORY:  As dictated below.  PAST SURGICAL HISTORY:  As dictated below.  ALLERGIES:  CODEINE causes nausea and vomiting.  MEDICATIONS:  1. Toprol-XL 75 mg q.d.  2. Enteric-coated aspirin 325 mg q.d.  3. Chlorothiazide 12.5 mg q.d.  FAMILY HISTORY:  Positive for mother with myocardial infarction.  Two of her brothers had myocardial infarctions and are deceased.  There are no strokes. There is no cancer.  SOCIAL HISTORY:  The patient is married.  Her husband is with her today.  They have been married for 54 years.  They have two  children, who live in [place], [place].  There is no history of cigarette use or abuse or alcohol use or abuse.  PHYSICAL EXAMINATION:  VITAL SIGNS:  In the emergency room she was afebrile.  Her blood pressure 124/83 on my examination.  Heart rate was in the 90s and irregular. Respirations were 15.  GENERAL:  She is a well-developed, elderly-appearing white female, sitting at 45 degrees, in no apparent distress.  HEENT:  Atraumatic, normocephalic.  PERRL.  Conjunctivae clear.  Sclerae nonicteric.  Oropharynx clear.  Mucous membranes moist.  NECK:  Supple.  No lymphadenopathy, no thyromegaly, no JVD, no bruits.  LUNGS:  Clear to auscultation bilaterally.  HEART:  Irregularly irregular with rate in the 90s.  I cannot appreciate any murmurs, rubs, or clicks.  ABDOMEN:  Soft, nontender, nondistended.  Positive bowel sounds.  No masses.  GU:  Examination not done.  EXTREMITIES:  Full range of motion.  No significant edema.  NEUROLOGIC:  Alert and oriented x3.  Cranial nerves II-XII grossly intact. Sensory and motor seemed to be grossly intact in the upper and lower extremities bilaterally, with no asymmetry noted.  Cerebellar examination was not tested.  LABORATORY DATA:  Head CT showed old lacunar infarct but no acute changes. EKG showed atrial fibrillation with heart rate of 101 but no ischemic or ST-T wave changes.  CPK was 53 with MB 1.6 and troponin 0.02.  PT 14.4, INR 1.3, PTT 30.  PROBLEM LIST:  1. Probable transient ischemic attack secondary to sudden onset of left eye     blurred vision today, which has resolved.  2. Rapid atrial fibrillation, with heart rate now of 90 to low 100s, on     Toprol alone for rate control.  She has been not been able to tolerate     digoxin in the past secondary to side effects.  3. No history of coronary artery disease.  She states she had cardiac      catheterization in 1999 which was negative for significant disease.   4. History of uterine endometrial adenocarcinoma, status post total abdominal     hysterectomy with bilateral salpingo-oophorectomy July 22, 2000 by Dr.     Leona Carry.  5. Atypical chest pain.  The patient states she has had two to three episodes     of atypical chest pain as described above the last few days.  She denies     any chest pain now.  Her electrocardiogram does not show any ischemic     changes at this time.  6. Glaucoma, status post surgery.  She is not on any medications now.  7. Questionable hypertension, on diuretic as noted above, but the patient     denies any history of hypertension and denies any history of lower     extremity edema.  8. Status post appendectomy and tonsillectomy.  9. CODEINE  intolerance, which causes nausea and vomiting. 10. Questionable history of right partial mastectomy in 1997 per old records.     Patient states she simply had a cyst removed at that time.  PLAN:  1. Admit to telemetry and begin Lovenox and Coumadin.  2. I will go ahead and cycle cardiac enzymes on her and check a 2D     echocardiogram and bilateral carotid Dopplers for this TIA work-up to see     if she has any significant stenosis or thrombus.  3. She may be a candidate for TEE.  4. I will increase her Toprol to 100 mg q.d. and see if that will work.  5. Would keep her heart rate around 60-70 resting.  6. We can use p.o. Cardizem versus a Cardizem drip if needed, although her     heart rate now is only in the 90s to low 100s.  7. I will just hold her diuretic for now and try to obtain the old     catheterization report from [facility].  8. We will go ahead and check a BNP and CBC secondary to being placed on     these medications.  9. I will DC her aspirin and go ahead and check a TSH level. 10. We will consider cardiology consultation on Monday when Dr. Dietrich Pates     returns. 11. She will need to be on fall precautions. 12. I will check a B-12,  sedimentation rate, and RPR for continued stroke     work-up. Dictated by:   Margaretann Loveless, M.D. Attending:  Margaretann Loveless, M.D. DD:  08/28/01 TD:  08/29/01 Job: [id number]

## 2010-10-05 NOTE — Discharge Summary (Signed)
NAME:  Katherine Mckinney, Katherine Mckinney NO.:  [id number]   MEDICAL RECORD NO.:  [id number]                   PATIENT TYPE:  IPS   LOCATION:  0506                                 FACILITY:  [facility]   PHYSICIAN:  Jeanice Lim, M.D.              DATE OF BIRTH:  [birth year]   DATE OF ADMISSION:  03/19/2002  DATE OF DISCHARGE:  04/01/2002                                 DISCHARGE SUMMARY   IDENTIFYING DATA:  This is a 77 year old widowed Caucasian female,  cooperative, reporting my mind is not working right.  The patient had poor  sleep, was worried about her husband's death, has felt continuously sad,  depressed, anxious, worried about multiple things in life, feeling that she  could not function,  had no appetite and had been taking samples of Zoloft  for two weeks and also took a Klonopin two weeks ago.   MEDICATIONS:  Zoloft 50 mg q.a.m., Klonopin 0.5 mg b.i.d., Coumadin 4 mg  q.a.m., Synthroid 0.05 mg p.o. q.d. and Toprol 75 mg q.h.s.  The patient had  tried Ambien and Valium recently and did not like their effects.   ALLERGIES:  DARVOCET.   PHYSICAL EXAMINATION:  Essentially within normal limits.  The patient  reported dizziness and gait was slightly unsteady.  Otherwise, physical  examination was within normal limits and neurologically nonfocal.   LABORATORY DATA:  Routine admission labs essentially within normal limits.   MENTAL STATUS EXAM:  Very emotionally preoccupied with poor concentration.  Alert and oriented x 3 but somewhat impaired short-term memory and  distractible, very anxious.  Affect intense with furrowed brow and frequent  crying.  Mood depressed.  Affect somewhat labile, ruminative and negative  thinking.  No overt psychotic symptoms.  Cognition was grossly intact.  Judgment and insight poor.   ADMISSION DIAGNOSES:   AXIS I:  Major depressive episode, severe, without psychotic features.   AXIS II:  None.   AXIS III:  1. History of atrial  fibrillation.  2. Hypothyroidism.   AXIS IV:  Moderate (problems with housing, finances and grief).   AXIS V:  50/75.   HOSPITAL COURSE:  The patient was admitted and ordered routine p.r.n.  medications and underwent further monitoring.  Was encouraged to participate  in individual, group and milieu treatment.  The patient was started on  Ativan p.r.n. and then titrated on Prozac.  Zoloft was discontinued and  patient was given Zyprexa Zydis for acute agitation and anxiety that was  very distressing to the patient and almost up to the psychotic level in  severity.  The patient reported gradual improvement in mood and Prozac was  optimized.  She participated in aftercare planning and family wanted patient  to be placed due to needs for supervision.  Coumadin was monitored by  pharmacy and patient reported no side effects from the medications.   CONDITION ON DISCHARGE:  Improved.  Mood was less depressed.  Affect  slightly brighter and less anxious.  Thought process more goal directed.  Thought content negative for dangerous ideation or psychotic symptoms.  The  patient reported motivation to be compliant with medications and follow-up  plan.   DISCHARGE MEDICATIONS:  1. Synthroid 50 mcg q.d.  2. Toprol XL 50 mg, 1/2 q.a.m.  3. Zyprexa 5 mg, 1/2 at 12 p.m. and 1 q.h.s.  4. Ativan 0.5 mg, 1/2 q.a.m., 1/2 at 3 p.m. and 1 q.h.s.  5. Coumadin 4 mg daily (to be followed by family physician).  6. Prozac 20 mg q.a.m.   FOLLOW UP:  Dr. Letitia Libra at [facility] on May 24, 2002 at  1:30 p.m.   DISCHARGE DIAGNOSES:   AXIS I:  Major depressive episode, severe, without psychotic features.   AXIS II:  None.   AXIS III:  1. History of atrial fibrillation.  2. Hypothyroidism.   AXIS IV:  Moderate (problems with housing, finances and grief).   AXIS V:  Global Assessment of Functioning on discharge 50.                                                Jeanice Lim,  M.D.    JEM/MEDQ  D:  05/12/2002  T:  05/12/2002  Job:  [id number]

## 2010-10-22 ENCOUNTER — Ambulatory Visit (INDEPENDENT_AMBULATORY_CARE_PROVIDER_SITE_OTHER): Payer: Medicare Other | Admitting: *Deleted

## 2010-10-22 DIAGNOSIS — Z7901 Long term (current) use of anticoagulants: Secondary | ICD-10-CM

## 2010-10-22 DIAGNOSIS — Z8679 Personal history of other diseases of the circulatory system: Secondary | ICD-10-CM

## 2010-10-22 DIAGNOSIS — I4891 Unspecified atrial fibrillation: Secondary | ICD-10-CM

## 2010-11-26 ENCOUNTER — Ambulatory Visit (INDEPENDENT_AMBULATORY_CARE_PROVIDER_SITE_OTHER): Payer: Medicare Other | Admitting: *Deleted

## 2010-11-26 DIAGNOSIS — I4891 Unspecified atrial fibrillation: Secondary | ICD-10-CM

## 2010-11-26 DIAGNOSIS — Z8679 Personal history of other diseases of the circulatory system: Secondary | ICD-10-CM

## 2010-11-26 DIAGNOSIS — Z7901 Long term (current) use of anticoagulants: Secondary | ICD-10-CM

## 2010-12-05 ENCOUNTER — Other Ambulatory Visit: Payer: Self-pay | Admitting: Cardiology

## 2010-12-24 ENCOUNTER — Encounter: Payer: Medicare Other | Admitting: *Deleted

## 2010-12-31 ENCOUNTER — Ambulatory Visit (INDEPENDENT_AMBULATORY_CARE_PROVIDER_SITE_OTHER): Payer: Medicare Other | Admitting: *Deleted

## 2010-12-31 DIAGNOSIS — Z7901 Long term (current) use of anticoagulants: Secondary | ICD-10-CM

## 2010-12-31 DIAGNOSIS — Z8679 Personal history of other diseases of the circulatory system: Secondary | ICD-10-CM

## 2010-12-31 DIAGNOSIS — I4891 Unspecified atrial fibrillation: Secondary | ICD-10-CM

## 2011-01-28 ENCOUNTER — Ambulatory Visit (INDEPENDENT_AMBULATORY_CARE_PROVIDER_SITE_OTHER): Payer: Medicare Other | Admitting: *Deleted

## 2011-01-28 DIAGNOSIS — Z7901 Long term (current) use of anticoagulants: Secondary | ICD-10-CM

## 2011-01-28 DIAGNOSIS — I4891 Unspecified atrial fibrillation: Secondary | ICD-10-CM

## 2011-01-28 DIAGNOSIS — Z8679 Personal history of other diseases of the circulatory system: Secondary | ICD-10-CM

## 2011-02-01 ENCOUNTER — Other Ambulatory Visit (HOSPITAL_COMMUNITY): Payer: Self-pay | Admitting: Internal Medicine

## 2011-02-01 DIAGNOSIS — Z139 Encounter for screening, unspecified: Secondary | ICD-10-CM

## 2011-02-07 ENCOUNTER — Ambulatory Visit (HOSPITAL_COMMUNITY)
Admission: RE | Admit: 2011-02-07 | Discharge: 2011-02-07 | Disposition: A | Discharge status: Home / Self Care | Payer: Medicare Other | Source: Ambulatory Visit | Attending: Internal Medicine | Admitting: Internal Medicine

## 2011-02-07 DIAGNOSIS — Z139 Encounter for screening, unspecified: Secondary | ICD-10-CM

## 2011-02-07 DIAGNOSIS — Z1231 Encounter for screening mammogram for malignant neoplasm of breast: Secondary | ICD-10-CM | POA: Insufficient documentation

## 2011-02-08 ENCOUNTER — Ambulatory Visit (HOSPITAL_COMMUNITY): Payer: Medicare Other

## 2011-02-14 ENCOUNTER — Other Ambulatory Visit: Payer: Self-pay | Admitting: Internal Medicine

## 2011-02-14 DIAGNOSIS — R928 Other abnormal and inconclusive findings on diagnostic imaging of breast: Secondary | ICD-10-CM

## 2011-02-25 ENCOUNTER — Ambulatory Visit (INDEPENDENT_AMBULATORY_CARE_PROVIDER_SITE_OTHER): Payer: Medicare Other | Admitting: *Deleted

## 2011-02-25 DIAGNOSIS — Z7901 Long term (current) use of anticoagulants: Secondary | ICD-10-CM

## 2011-02-25 DIAGNOSIS — Z8679 Personal history of other diseases of the circulatory system: Secondary | ICD-10-CM

## 2011-02-25 DIAGNOSIS — I4891 Unspecified atrial fibrillation: Secondary | ICD-10-CM

## 2011-02-25 LAB — POCT INR: INR: 1.9

## 2011-03-06 ENCOUNTER — Ambulatory Visit (HOSPITAL_COMMUNITY)
Admission: RE | Admit: 2011-03-06 | Discharge: 2011-03-06 | Disposition: A | Discharge status: Home / Self Care | Payer: Medicare Other | Source: Ambulatory Visit | Attending: Internal Medicine | Admitting: Internal Medicine

## 2011-03-06 DIAGNOSIS — R928 Other abnormal and inconclusive findings on diagnostic imaging of breast: Secondary | ICD-10-CM

## 2011-03-07 ENCOUNTER — Encounter: Payer: Self-pay | Admitting: Cardiology

## 2011-03-07 DIAGNOSIS — Z0189 Encounter for other specified special examinations: Secondary | ICD-10-CM | POA: Insufficient documentation

## 2011-03-25 ENCOUNTER — Ambulatory Visit (INDEPENDENT_AMBULATORY_CARE_PROVIDER_SITE_OTHER): Payer: Medicare Other | Admitting: *Deleted

## 2011-03-25 DIAGNOSIS — Z8679 Personal history of other diseases of the circulatory system: Secondary | ICD-10-CM

## 2011-03-25 DIAGNOSIS — Z7901 Long term (current) use of anticoagulants: Secondary | ICD-10-CM

## 2011-03-25 DIAGNOSIS — I4891 Unspecified atrial fibrillation: Secondary | ICD-10-CM

## 2011-03-25 LAB — POCT INR: INR: 3.4

## 2011-03-25 MED ORDER — WARFARIN SODIUM 4 MG PO TABS: 4.0000 mg | ORAL_TABLET | ORAL | Status: DC

## 2011-04-22 ENCOUNTER — Ambulatory Visit (INDEPENDENT_AMBULATORY_CARE_PROVIDER_SITE_OTHER): Payer: Medicare Other | Admitting: *Deleted

## 2011-04-22 DIAGNOSIS — I4891 Unspecified atrial fibrillation: Secondary | ICD-10-CM

## 2011-04-22 DIAGNOSIS — Z7901 Long term (current) use of anticoagulants: Secondary | ICD-10-CM

## 2011-04-22 DIAGNOSIS — Z8679 Personal history of other diseases of the circulatory system: Secondary | ICD-10-CM

## 2011-04-22 LAB — POCT INR: INR: 3.3

## 2011-05-10 ENCOUNTER — Encounter: Payer: Self-pay | Admitting: Cardiology

## 2011-05-20 ENCOUNTER — Ambulatory Visit (INDEPENDENT_AMBULATORY_CARE_PROVIDER_SITE_OTHER): Payer: Medicare Other | Admitting: *Deleted

## 2011-05-20 DIAGNOSIS — Z7901 Long term (current) use of anticoagulants: Secondary | ICD-10-CM

## 2011-05-20 DIAGNOSIS — Z8679 Personal history of other diseases of the circulatory system: Secondary | ICD-10-CM

## 2011-05-20 DIAGNOSIS — I4891 Unspecified atrial fibrillation: Secondary | ICD-10-CM

## 2011-05-20 LAB — POCT INR: INR: 1.7

## 2011-06-17 ENCOUNTER — Ambulatory Visit (INDEPENDENT_AMBULATORY_CARE_PROVIDER_SITE_OTHER): Payer: Medicare Other | Admitting: *Deleted

## 2011-06-17 DIAGNOSIS — Z7901 Long term (current) use of anticoagulants: Secondary | ICD-10-CM

## 2011-06-17 DIAGNOSIS — Z8679 Personal history of other diseases of the circulatory system: Secondary | ICD-10-CM

## 2011-06-17 DIAGNOSIS — I4891 Unspecified atrial fibrillation: Secondary | ICD-10-CM

## 2011-07-19 ENCOUNTER — Ambulatory Visit (INDEPENDENT_AMBULATORY_CARE_PROVIDER_SITE_OTHER): Payer: Medicare Other | Admitting: *Deleted

## 2011-07-19 DIAGNOSIS — Z7901 Long term (current) use of anticoagulants: Secondary | ICD-10-CM

## 2011-07-19 DIAGNOSIS — Z8679 Personal history of other diseases of the circulatory system: Secondary | ICD-10-CM

## 2011-07-19 DIAGNOSIS — I4891 Unspecified atrial fibrillation: Secondary | ICD-10-CM

## 2011-07-29 ENCOUNTER — Encounter: Payer: Self-pay | Admitting: Internal Medicine

## 2011-07-30 ENCOUNTER — Ambulatory Visit (INDEPENDENT_AMBULATORY_CARE_PROVIDER_SITE_OTHER): Payer: Medicare Other | Admitting: Gastroenterology

## 2011-07-30 ENCOUNTER — Encounter: Payer: Self-pay | Admitting: Gastroenterology

## 2011-07-30 VITALS — BP 102/69 | HR 78 | Temp 97.2°F | Ht 65.0 in | Wt 117.6 lb

## 2011-07-30 DIAGNOSIS — Z7901 Long term (current) use of anticoagulants: Secondary | ICD-10-CM

## 2011-07-30 DIAGNOSIS — R1314 Dysphagia, pharyngoesophageal phase: Secondary | ICD-10-CM

## 2011-07-30 DIAGNOSIS — D126 Benign neoplasm of colon, unspecified: Secondary | ICD-10-CM

## 2011-07-30 DIAGNOSIS — R634 Abnormal weight loss: Secondary | ICD-10-CM | POA: Insufficient documentation

## 2011-07-30 DIAGNOSIS — R1310 Dysphagia, unspecified: Secondary | ICD-10-CM | POA: Insufficient documentation

## 2011-07-30 NOTE — Patient Instructions (Signed)
I will be discussing the upper endoscopy with your daughter. Once scheduled, we will give you further instructions.

## 2011-07-30 NOTE — Assessment & Plan Note (Addendum)
Patient with h/o tubular adenoma 01/2007. Would offer her colonoscopy for Surveillance, but as discussed with patient who at this time is not clearly agreeable, guidelines are not as clear as far as what age we should stop screening/surveillance colonoscopies. Would recommend she discuss it with Dr. Ouida Sills as well as we can readdress later this year when she becomes due.

## 2011-07-30 NOTE — Progress Notes (Signed)
Faxed to PCP

## 2011-07-30 NOTE — Assessment & Plan Note (Addendum)
Ongoing weight loss. Several month history of difficulty swallowing with oropharyngeal component as outlined above. Patient also describes having difficulty swallowing solid food and pills. She likely has esophageal component as well such as web, ring, stricture. We discussed options of upper endoscopy with dilation versus barium pill esophagram. Recommend upper endoscopy for diagnostic and therapeutic purposes, which would have to be done off of Coumadin therapy. Patient is agreeable but would like me to discuss this with her daughter Emelia Loron who will also be present for the procedure. We will make arrangements once I am able to touch base with Ms. Montgomery.  I left message on her voicemail today.  I have discussed the risks, alternatives, benefits with regards to but not limited to the risk of reaction to medication, bleeding, infection, perforation and the patient is agreeable to proceed. Written consent to be obtained.  WE WILL HAVE HER HOLD HER COUMADIN FOR FOUR DAYS BEFORE PROCEDURE.

## 2011-07-30 NOTE — Progress Notes (Signed)
Primary Care Physician:  Carylon Perches, MD, MD  Primary Gastroenterologist:  Roetta Sessions, MD   Chief Complaint  Patient presents with  . Dysphagia    HPI:  Katherine Mckinney is a 78 y.o. female here for further evaluation of dysphagia. She recently became a resident at [facility], assisted living facility. She tells me this is only temporary until she is able to move in with family. She has undergone speech therapy evaluation and found to have mild to moderate oropharyngeal dysphagia characterized by slow oral transit time, weak labial seal, uncontrolled bolus, delayed initiation of swallow, decreased laryngeal elongation and excessive amounts of bolus presented before swallow. Per medical records received from the speech therapist she has history of anxiety with the fear of swallowing as well. She has underwent dysphagia therapy and is currently recommended a regular diet. Patient also complained to speech therapist that food "gets stuck and won't go down". Therefore GI referral was initiated.  Symptoms began in 02/2011 per patient. Difficulty swallowing larger pills. Has to take with applesauce or crush. Difficult to swallow solid foods as well. Doesn't like to eat for fear of food getting caught. No odynophagia, heartburn. Occasionally food comes back up. No abdominal pain. BMs doing okay on Miralax. Weight initially 130 per patient. Now 117lb. No melena, brbpr. No prior EGD. Last TCS 01/2007 and had tubular adenoma. Dr. Jena Gauss recommended TCS 01/2012. Patient states she may not want to have it done given her age. Not ready to decide.  Current Outpatient Prescriptions  Medication Sig Dispense Refill  . beta carotene w/minerals (OCUVITE) tablet Take 1 tablet by mouth daily.        . clonazePAM (KLONOPIN) 0.5 MG tablet Take 0.25 mg by mouth 2 (two) times daily.       . clonazePAM (KLONOPIN) 0.5 MG tablet Take 0.5 mg by mouth daily. At 4pm      . DULoxetine (CYMBALTA) 30 MG capsule Take 30 mg by mouth  daily.      Marland Kitchen levothyroxine (SYNTHROID, LEVOTHROID) 50 MCG tablet Take 50 mcg by mouth daily.      . metoprolol (TOPROL-XL) 50 MG 24 hr tablet TAKE ONE & ONE-HALF TABLETS BY MOUTH EVERY DAY  135 tablet  8  . mirtazapine (REMERON) 15 MG tablet Take 15 mg by mouth at bedtime.        . polyethylene glycol (MIRALAX / GLYCOLAX) packet Take 17 g by mouth daily.      . traZODone (DESYREL) 50 MG tablet Take 50 mg by mouth at bedtime.      Marland Kitchen warfarin (COUMADIN) 4 MG tablet Take 4 mg by mouth as directed. Take 1  tablets daily on M,W,F      . warfarin (COUMADIN) 6 MG tablet Take 1 tablet by mouth. Daily on sat, sun, tues, thur        Allergies as of 07/30/2011 - Review Complete 07/30/2011  Allergen Reaction Noted  . Codeine      Past Medical History  Diagnosis Date  . Chronic atrial fibrillation   . Hyperlipemia   . ASCVD (arteriosclerotic cardiovascular disease)   . Uterine carcinoma   . Glaucoma   . Depression   . Anxiety   . Hypothyroidism     Past Surgical History  Procedure Date  . Tonsillectomy   . Appendectomy   . Breast excisional biopsy 1997    Right  . Total abdominal hysterectomy w/ bilateral salpingoophorectomy 07/2000  . Colonoscopy 01/23/07    diminutive rectal polyp/scattered sigmoid  diverticula/tubular adenoma. Due 01/2012 if health permits/patient agreeable.    Family History  Problem Relation Age of Onset  . Heart attack Mother   . Lung cancer Father     Black lung  . Coronary artery disease Brother   . Heart attack Brother   . Coronary artery disease Brother   . Heart attack Brother     History   Social History  . Marital Status: Widowed    Spouse Name: N/A    Number of Children: 2  . Years of Education: N/A   Occupational History  .     [employer] RETIRED    Social History Main Topics  . Smoking status: Never Smoker   . Smokeless tobacco: Not on file  . Alcohol Use: No  . Drug Use: No  . Sexually Active: Not on file   Other Topics Concern  . Not on  file   Social History Narrative   Widowed.Daughter, Emelia Loron, Nurse Practitioner. Cell [phone]. Home [phone]. Work [phone] X 130.Son, Chaye Misch, pharmacist. [phone].Exercises regularly      ROS:  General: Negative for anorexia, fever, chills, fatigue, weakness. See HPI.  Eyes: Negative for vision changes.  ENT: Negative for hoarseness,  nasal congestion. See HPI. CV: Negative for chest pain, angina, palpitations, dyspnea on exertion, peripheral edema.  Respiratory: Negative for dyspnea at rest, dyspnea on exertion, cough, sputum, wheezing.  GI: See history of present illness. GU:  Negative for dysuria, hematuria, urinary incontinence, urinary frequency, nocturnal urination.  MS: Negative for joint pain, low back pain.  Derm: Negative for rash or itching.  Neuro: Negative for weakness, abnormal sensation, seizure, frequent headaches, memory loss, confusion.  Psych: Negative for anxiety, depression, suicidal ideation, hallucinations.  Endo: See HPI. Heme: Negative for bruising or bleeding. Allergy: Negative for rash or hives.    Physical Examination:  BP 102/69  Pulse 78  Temp(Src) 97.2 F (36.2 C) (Temporal)  Ht 5\' 5"  (1.651 m)  Wt 117 lb 9.6 oz (53.343 kg)  BMI 19.57 kg/m2   General: Elderly.Thin. NAD. Head: Normocephalic, atraumatic.   Eyes: Conjunctiva pink, no icterus. Mouth: Oropharyngeal mucosa moist and pink , no lesions erythema or exudate. Neck: Supple without thyromegaly, masses, or lymphadenopathy.  Lungs: Clear to auscultation bilaterally.  Heart: Regular rate and rhythm, no murmurs rubs or gallops.  Abdomen: Bowel sounds are normal, nontender, nondistended, no hepatosplenomegaly or masses, no abdominal bruits or    hernia , no rebound or guarding.   Rectal: Not performed. Extremities: No lower extremity edema. No clubbing or deformities.  Neuro: Alert and oriented x 4 , grossly normal neurologically.  Skin: Warm and dry, no rash  or jaundice.   Psych: Alert and cooperative. Flat affect.  Labs: 02/11/2011. White blood cell count 6500, hemoglobin 14.7, platelets 214,000, creatinine 1.01, total bilirubin 0.5, alkaline phosphatase 58, AST 22, ALT 14, albumin 4.5

## 2011-07-31 NOTE — Progress Notes (Signed)
Spoke with patient's daughter Emelia Loron today. All parties are agreeable with EGD.  Please schedule EGD with possible esophageal dilation with Dr. Jena Gauss. Once you have a date picked out, please contact patient's daughter at [phone]. Patient is not available for procedure on March 21 or 22nd.  Patient needs to hold Coumadin for 4 days prior to procedure.

## 2011-08-01 ENCOUNTER — Other Ambulatory Visit: Payer: Self-pay | Admitting: Gastroenterology

## 2011-08-01 NOTE — Progress Notes (Signed)
PT IS SCHEDULED FOR 03/27- FAXED INSTRUCTIONS TO [phone]

## 2011-08-01 NOTE — Progress Notes (Signed)
LM on daughter's VM

## 2011-08-08 ENCOUNTER — Encounter (HOSPITAL_COMMUNITY): Payer: Self-pay | Admitting: Pharmacy Technician

## 2011-08-13 MED ORDER — SODIUM CHLORIDE 0.45 % IV SOLN
Freq: Once | INTRAVENOUS | Status: AC
  Administered 2011-08-14: 11:00:00 via INTRAVENOUS

## 2011-08-14 ENCOUNTER — Ambulatory Visit (HOSPITAL_COMMUNITY)
Admission: RE | Admit: 2011-08-14 | Discharge: 2011-08-14 | Disposition: A | Discharge status: Home / Self Care | Payer: Medicare Other | Source: Ambulatory Visit | Attending: Internal Medicine | Admitting: Internal Medicine

## 2011-08-14 ENCOUNTER — Encounter (HOSPITAL_COMMUNITY): Payer: Self-pay | Admitting: *Deleted

## 2011-08-14 ENCOUNTER — Encounter (HOSPITAL_COMMUNITY)
Admission: RE | Disposition: A | Discharge status: Home / Self Care | Payer: Self-pay | Source: Ambulatory Visit | Attending: Internal Medicine

## 2011-08-14 DIAGNOSIS — R1310 Dysphagia, unspecified: Secondary | ICD-10-CM

## 2011-08-14 DIAGNOSIS — K210 Gastro-esophageal reflux disease with esophagitis: Secondary | ICD-10-CM

## 2011-08-14 DIAGNOSIS — Z7901 Long term (current) use of anticoagulants: Secondary | ICD-10-CM | POA: Insufficient documentation

## 2011-08-14 DIAGNOSIS — K449 Diaphragmatic hernia without obstruction or gangrene: Secondary | ICD-10-CM

## 2011-08-14 DIAGNOSIS — E785 Hyperlipidemia, unspecified: Secondary | ICD-10-CM | POA: Insufficient documentation

## 2011-08-14 DIAGNOSIS — K222 Esophageal obstruction: Secondary | ICD-10-CM | POA: Insufficient documentation

## 2011-08-14 HISTORY — PX: ESOPHAGOGASTRODUODENOSCOPY: SHX5428

## 2011-08-14 HISTORY — PX: MALONEY DILATION: SHX5535

## 2011-08-14 LAB — PROTIME-INR: INR: 1.17 (ref 0.00–1.49)

## 2011-08-14 SURGERY — EGD (ESOPHAGOGASTRODUODENOSCOPY)
Anesthesia: Moderate Sedation

## 2011-08-14 MED ORDER — BUTAMBEN-TETRACAINE-BENZOCAINE 2-2-14 % EX AERO
INHALATION_SPRAY | CUTANEOUS | Status: DC | PRN
  Administered 2011-08-14: 2 via TOPICAL

## 2011-08-14 MED ORDER — MIDAZOLAM HCL 5 MG/5ML IJ SOLN
INTRAMUSCULAR | Status: DC | PRN
  Administered 2011-08-14 (×2): 1 mg via INTRAVENOUS

## 2011-08-14 MED ORDER — STERILE WATER FOR IRRIGATION IR SOLN
Status: DC | PRN
  Administered 2011-08-14: 11:00:00

## 2011-08-14 MED ORDER — MIDAZOLAM HCL 5 MG/5ML IJ SOLN
INTRAMUSCULAR | Status: AC
  Filled 2011-08-14: qty 5

## 2011-08-14 MED ORDER — MEPERIDINE HCL 100 MG/ML IJ SOLN
INTRAMUSCULAR | Status: AC
  Filled 2011-08-14: qty 1

## 2011-08-14 MED ORDER — MEPERIDINE HCL 100 MG/ML IJ SOLN
INTRAMUSCULAR | Status: DC | PRN
  Administered 2011-08-14 (×2): 25 mg via INTRAVENOUS

## 2011-08-14 NOTE — Op Note (Signed)
[facility] [address] [place], [place]  [address]  ENDOSCOPY PROCEDURE REPORT  PATIENT:  Katherine Mckinney, Katherine Mckinney  MR#:  [id number] BIRTHDATE:  09-Aug-1924, 87 yrs. old  GENDER:  female  ENDOSCOPIST:  R. Roetta Sessions, MD Caleen Essex Referred by:  Carylon Perches, M.D.  PROCEDURE DATE:  08/14/2011 PROCEDURE:  EGD with Elease Hashimoto dilation; INR  1.17 today  INDICATIONS:   esophageal dysphagia.  INFORMED CONSENT:   The risks, benefits, limitations, alternatives and imponderables have been discussed.  The potential for biopsy, esophogeal dilation, etc. have also been reviewed.  Questions have been answered.  All parties agreeable.  Please see the history and physical in the medical record for more information.  MEDICATIONS:     Versed 2 mg IV Demerol 50 mg IV in divided doses. Cetacaine spray.  DESCRIPTION OF PROCEDURE:   The EG-[id number] ([id number]) endoscope was introduced through the mouth and advanced to the second portion of the duodenum without difficulty or limitations.  The mucosal surfaces were surveyed very carefully during advancement of the scope and upon withdrawal.  Retroflexion view of the proximal stomach and esophagogastric junction was performed.  <<PROCEDUREIMAGES>>  FINDINGS:    2 inlet patches noted.  Single 3 mm distal esophageal erosion. Tubular esophagus patent. Stomach empty. Small hiatal hernia; otherwise remainder of gastric mucosa appeared normal. Pylorus patent. Normal first and second portion of the     duodenum.  THERAPEUTIC / DIAGNOSTIC MANEUVERS PERFORMED:  A 54 French Maloney dilator was passed to full insertion with mild resistance upon full insertion. A look back revealed a small tear at the GE junction and the suggestion of a subtle noncritical submucosal ring.  COMPLICATIONS:   None  IMPRESSION:   Mild erosive reflux esophagitis. Probable subtle submucosal ring at the GE junction-status post [facility] dilation. Small hiatal hernia.   Some of her  dysphagia is oropharyngeal in nature and may be more of a challenge to treat. However, she may derive  significant improvement in her symptoms after today's maneuver along with a course of acid suppression therapy.  RECOMMENDATIONS:    Cautiously advance diet later today. Course of protonix. 40 mg orally daily. Resume Coumadin today. INR will be followed by Dr. Ouida Sills.  I discussed the findings and recommendations with the patient's daughter, Ms. Montgomery, at length today.  ______________________________ R. Roetta Sessions, MD Caleen Essex  CC:  n. eSIGNED:   R. Roetta Sessions at 08/14/2011 11:57 AM  Wendy Poet, [phone]

## 2011-08-14 NOTE — Discharge Instructions (Signed)
EGD Discharge instructions Please read the instructions outlined below and refer to this sheet in the next few weeks. These discharge instructions provide you with general information on caring for yourself after you leave the hospital. Your doctor may also give you specific instructions. While your treatment has been planned according to the most current medical practices available, unavoidable complications occasionally occur. If you have any problems or questions after discharge, please call your doctor. ACTIVITY  You may resume your regular activity but move at a slower pace for the next 24 hours.   Take frequent rest periods for the next 24 hours.   Walking will help expel (get rid of) the air and reduce the bloated feeling in your abdomen.   No driving for 24 hours (because of the anesthesia (medicine) used during the test).   You may shower.   Do not sign any important legal documents or operate any machinery for 24 hours (because of the anesthesia used during the test).  NUTRITION  Drink plenty of fluids.   You may resume your normal diet.   Begin with a light meal and progress to your normal diet.   Avoid alcoholic beverages for 24 hours or as instructed by your caregiver.  MEDICATIONS  You may resume your normal medications unless your caregiver tells you otherwise.  WHAT YOU CAN EXPECT TODAY  You may experience abdominal discomfort such as a feeling of fullness or "gas" pains.  FOLLOW-UP  Your doctor will discuss the results of your test with you.  SEEK IMMEDIATE MEDICAL ATTENTION IF ANY OF THE FOLLOWING OCCUR:  Excessive nausea (feeling sick to your stomach) and/or vomiting.   Severe abdominal pain and distention (swelling).   Trouble swallowing.   Temperature over 101 F (37.8 C).   Rectal bleeding or vomiting of blood.    Resume Coumadin today.  Advance diet as tolerated later today.        Protonix  40 mg daily for acid reflux

## 2011-08-14 NOTE — H&P (View-Only) (Signed)
Faxed to PCP

## 2011-08-14 NOTE — OR Nursing (Signed)
Patient in for EGD/ED. Patient's MAR states she took Coumadin 4mg  yesterday. Called [place] and spoke with Lucinda to verify. Patient stopped Coumadin on March 22nd-March 25th and resumed yesterday. Dr. Jena Gauss notified and ordered PT/INR.

## 2011-08-14 NOTE — Interval H&P Note (Signed)
History and Physical Interval Note:  08/14/2011 11:23 AM  Katherine Mckinney  has presented today for surgery, with the diagnosis of dysphagia  The various methods of treatment have been discussed with the patient and family. After consideration of risks, benefits and other options for treatment, the patient has consented to  Procedure(s) (LRB): ESOPHAGOGASTRODUODENOSCOPY (EGD) (N/A) SAVORY DILATION (N/A) MALONEY DILATION (N/A) as a surgical intervention .  The patients' history has been reviewed, patient examined, no change in status, stable for surgery.  I have reviewed the patients' chart and labs.  Questions were answered to the patient's satisfaction.     Eula Listen

## 2011-08-15 ENCOUNTER — Encounter (HOSPITAL_COMMUNITY): Payer: Self-pay | Admitting: Internal Medicine

## 2011-08-27 ENCOUNTER — Ambulatory Visit (INDEPENDENT_AMBULATORY_CARE_PROVIDER_SITE_OTHER): Payer: Medicare Other | Admitting: *Deleted

## 2011-08-27 DIAGNOSIS — Z7901 Long term (current) use of anticoagulants: Secondary | ICD-10-CM

## 2011-08-27 DIAGNOSIS — Z8679 Personal history of other diseases of the circulatory system: Secondary | ICD-10-CM

## 2011-08-27 DIAGNOSIS — I4891 Unspecified atrial fibrillation: Secondary | ICD-10-CM

## 2011-09-17 ENCOUNTER — Ambulatory Visit (INDEPENDENT_AMBULATORY_CARE_PROVIDER_SITE_OTHER): Payer: Medicare Other | Admitting: *Deleted

## 2011-09-17 DIAGNOSIS — I4891 Unspecified atrial fibrillation: Secondary | ICD-10-CM

## 2011-09-17 DIAGNOSIS — Z8679 Personal history of other diseases of the circulatory system: Secondary | ICD-10-CM

## 2011-09-17 DIAGNOSIS — Z7901 Long term (current) use of anticoagulants: Secondary | ICD-10-CM

## 2011-10-08 ENCOUNTER — Ambulatory Visit (INDEPENDENT_AMBULATORY_CARE_PROVIDER_SITE_OTHER): Payer: Medicare Other | Admitting: *Deleted

## 2011-10-08 DIAGNOSIS — Z7901 Long term (current) use of anticoagulants: Secondary | ICD-10-CM

## 2011-10-08 DIAGNOSIS — I4891 Unspecified atrial fibrillation: Secondary | ICD-10-CM

## 2011-10-08 DIAGNOSIS — Z8679 Personal history of other diseases of the circulatory system: Secondary | ICD-10-CM

## 2011-10-08 LAB — POCT INR: INR: 2.4

## 2011-10-15 ENCOUNTER — Other Ambulatory Visit: Payer: Self-pay | Admitting: Pharmacist

## 2011-10-15 MED ORDER — WARFARIN SODIUM 6 MG PO TABS: ORAL_TABLET | ORAL | Status: DC

## 2011-10-16 ENCOUNTER — Other Ambulatory Visit: Payer: Self-pay | Admitting: Pharmacist

## 2011-10-16 MED ORDER — WARFARIN SODIUM 4 MG PO TABS: ORAL_TABLET | ORAL | Status: DC

## 2011-11-05 ENCOUNTER — Ambulatory Visit (INDEPENDENT_AMBULATORY_CARE_PROVIDER_SITE_OTHER): Payer: Medicare Other | Admitting: *Deleted

## 2011-11-05 DIAGNOSIS — Z8679 Personal history of other diseases of the circulatory system: Secondary | ICD-10-CM

## 2011-11-05 DIAGNOSIS — Z7901 Long term (current) use of anticoagulants: Secondary | ICD-10-CM

## 2011-11-05 DIAGNOSIS — I4891 Unspecified atrial fibrillation: Secondary | ICD-10-CM

## 2011-11-05 LAB — POCT INR: INR: 2.7

## 2011-11-11 ENCOUNTER — Telehealth: Payer: Self-pay | Admitting: Internal Medicine

## 2011-11-11 NOTE — Telephone Encounter (Signed)
Continue protonix  

## 2011-11-11 NOTE — Telephone Encounter (Signed)
Pt is aware.  

## 2011-11-11 NOTE — Telephone Encounter (Signed)
Pt called to ask if the protonix RMR put her on to try for a month after her procedure was something he wanted her to continue. Please advise and call patient at [phone]

## 2011-12-03 ENCOUNTER — Ambulatory Visit (INDEPENDENT_AMBULATORY_CARE_PROVIDER_SITE_OTHER): Payer: Medicare Other | Admitting: *Deleted

## 2011-12-03 DIAGNOSIS — Z7901 Long term (current) use of anticoagulants: Secondary | ICD-10-CM

## 2011-12-03 DIAGNOSIS — Z8679 Personal history of other diseases of the circulatory system: Secondary | ICD-10-CM

## 2011-12-03 DIAGNOSIS — I4891 Unspecified atrial fibrillation: Secondary | ICD-10-CM

## 2011-12-03 LAB — POCT INR: INR: 2.7

## 2011-12-24 ENCOUNTER — Other Ambulatory Visit: Payer: Self-pay | Admitting: Cardiology

## 2011-12-24 MED ORDER — WARFARIN SODIUM 6 MG PO TABS: ORAL_TABLET | ORAL | Status: DC

## 2011-12-24 MED ORDER — WARFARIN SODIUM 4 MG PO TABS: ORAL_TABLET | ORAL | Status: DC

## 2011-12-31 ENCOUNTER — Ambulatory Visit (INDEPENDENT_AMBULATORY_CARE_PROVIDER_SITE_OTHER): Payer: Medicare Other | Admitting: *Deleted

## 2011-12-31 DIAGNOSIS — Z7901 Long term (current) use of anticoagulants: Secondary | ICD-10-CM

## 2011-12-31 DIAGNOSIS — Z8679 Personal history of other diseases of the circulatory system: Secondary | ICD-10-CM

## 2011-12-31 DIAGNOSIS — I4891 Unspecified atrial fibrillation: Secondary | ICD-10-CM

## 2011-12-31 LAB — POCT INR: INR: 2.5

## 2012-01-28 ENCOUNTER — Encounter: Payer: Self-pay | Admitting: *Deleted

## 2012-01-28 ENCOUNTER — Ambulatory Visit (INDEPENDENT_AMBULATORY_CARE_PROVIDER_SITE_OTHER): Payer: Medicare Other | Admitting: *Deleted

## 2012-01-28 DIAGNOSIS — I4891 Unspecified atrial fibrillation: Secondary | ICD-10-CM

## 2012-01-28 DIAGNOSIS — Z8679 Personal history of other diseases of the circulatory system: Secondary | ICD-10-CM

## 2012-01-28 DIAGNOSIS — Z7901 Long term (current) use of anticoagulants: Secondary | ICD-10-CM

## 2012-01-28 LAB — POCT INR: INR: 2.6

## 2012-03-10 ENCOUNTER — Ambulatory Visit (INDEPENDENT_AMBULATORY_CARE_PROVIDER_SITE_OTHER): Payer: Medicare Other | Admitting: *Deleted

## 2012-03-10 DIAGNOSIS — Z8679 Personal history of other diseases of the circulatory system: Secondary | ICD-10-CM

## 2012-03-10 DIAGNOSIS — I4891 Unspecified atrial fibrillation: Secondary | ICD-10-CM

## 2012-03-10 DIAGNOSIS — Z7901 Long term (current) use of anticoagulants: Secondary | ICD-10-CM

## 2012-03-10 LAB — POCT INR: INR: 2.3

## 2012-04-03 ENCOUNTER — Encounter: Payer: Self-pay | Admitting: Cardiology

## 2012-04-07 ENCOUNTER — Other Ambulatory Visit: Payer: Self-pay | Admitting: *Deleted

## 2012-04-07 MED ORDER — WARFARIN SODIUM 6 MG PO TABS: ORAL_TABLET | ORAL | Status: DC

## 2012-04-07 MED ORDER — WARFARIN SODIUM 4 MG PO TABS: ORAL_TABLET | ORAL | Status: DC

## 2012-04-09 ENCOUNTER — Telehealth: Payer: Self-pay | Admitting: *Deleted

## 2012-04-09 MED ORDER — WARFARIN SODIUM 4 MG PO TABS: ORAL_TABLET | ORAL | Status: DC

## 2012-04-09 NOTE — Telephone Encounter (Signed)
New coumadin pill pack doesn't have instructions.  Sent request to [facility] to fax instruction label to [facility]

## 2012-04-21 ENCOUNTER — Ambulatory Visit (INDEPENDENT_AMBULATORY_CARE_PROVIDER_SITE_OTHER): Payer: Medicare Other | Admitting: *Deleted

## 2012-04-21 DIAGNOSIS — Z7901 Long term (current) use of anticoagulants: Secondary | ICD-10-CM

## 2012-04-21 DIAGNOSIS — Z8679 Personal history of other diseases of the circulatory system: Secondary | ICD-10-CM

## 2012-04-21 DIAGNOSIS — I4891 Unspecified atrial fibrillation: Secondary | ICD-10-CM

## 2012-06-02 ENCOUNTER — Ambulatory Visit (INDEPENDENT_AMBULATORY_CARE_PROVIDER_SITE_OTHER): Payer: Medicare Other | Admitting: *Deleted

## 2012-06-02 DIAGNOSIS — Z7901 Long term (current) use of anticoagulants: Secondary | ICD-10-CM

## 2012-06-02 DIAGNOSIS — I4891 Unspecified atrial fibrillation: Secondary | ICD-10-CM

## 2012-06-02 DIAGNOSIS — Z8679 Personal history of other diseases of the circulatory system: Secondary | ICD-10-CM

## 2012-07-14 ENCOUNTER — Ambulatory Visit (INDEPENDENT_AMBULATORY_CARE_PROVIDER_SITE_OTHER): Payer: Medicare Other | Admitting: *Deleted

## 2012-07-14 DIAGNOSIS — I4891 Unspecified atrial fibrillation: Secondary | ICD-10-CM

## 2012-07-14 DIAGNOSIS — Z8679 Personal history of other diseases of the circulatory system: Secondary | ICD-10-CM

## 2012-07-14 DIAGNOSIS — Z7901 Long term (current) use of anticoagulants: Secondary | ICD-10-CM

## 2012-07-14 LAB — POCT INR: INR: 2.7

## 2012-07-28 ENCOUNTER — Other Ambulatory Visit: Payer: Self-pay | Admitting: Cardiology

## 2012-07-28 MED ORDER — WARFARIN SODIUM 4 MG PO TABS: ORAL_TABLET | ORAL | Status: DC

## 2012-08-19 ENCOUNTER — Encounter: Payer: Self-pay | Admitting: Cardiology

## 2012-08-19 ENCOUNTER — Ambulatory Visit (INDEPENDENT_AMBULATORY_CARE_PROVIDER_SITE_OTHER): Payer: Medicare Other | Admitting: Cardiology

## 2012-08-19 VITALS — BP 160/87 | HR 78 | Ht 66.0 in | Wt 127.0 lb

## 2012-08-19 DIAGNOSIS — I7090 Unspecified atherosclerosis: Secondary | ICD-10-CM

## 2012-08-19 DIAGNOSIS — I2510 Atherosclerotic heart disease of native coronary artery without angina pectoris: Secondary | ICD-10-CM

## 2012-08-19 DIAGNOSIS — I4891 Unspecified atrial fibrillation: Secondary | ICD-10-CM

## 2012-08-19 DIAGNOSIS — Z7901 Long term (current) use of anticoagulants: Secondary | ICD-10-CM

## 2012-08-19 DIAGNOSIS — E785 Hyperlipidemia, unspecified: Secondary | ICD-10-CM

## 2012-08-19 DIAGNOSIS — H409 Unspecified glaucoma: Secondary | ICD-10-CM | POA: Insufficient documentation

## 2012-08-19 NOTE — Assessment & Plan Note (Signed)
No symptoms to suggest progression of disease.

## 2012-08-19 NOTE — Progress Notes (Signed)
Patient ID: Katherine Mckinney, female   DOB: 06/28/1924, 79 y.o.   MRN: [id number]  HPI: Schedule return visit for this delightful older woman with long-standing atrial fibrillation. Since her last visit, she has continued to do remarkably well. She did develop anxiety related to living alone and has moved to a retirement facility.  She is less active and is no longer traveling or driving, but she walks and does light exercise without any cardiopulmonary symptoms.  Current Outpatient Prescriptions  Medication Sig Dispense Refill  . acetaminophen (TYLENOL) 500 MG tablet Take 1,000 mg by mouth 3 (three) times daily as needed. For pain/fever      . beta carotene w/minerals (OCUVITE) tablet Take 1 tablet by mouth daily.        . clonazePAM (KLONOPIN) 0.5 MG tablet Take 0.25-0.5 mg by mouth 3 (three) times daily. 0.25 mg  At 8 am and 12 pm and 0.5 mg at 4 pm      . DULoxetine (CYMBALTA) 30 MG capsule Take 40 mg by mouth daily.       Marland Kitchen levothyroxine (SYNTHROID, LEVOTHROID) 50 MCG tablet Take 50 mcg by mouth daily.      . metoprolol succinate (TOPROL-XL) 25 MG 24 hr tablet Take 25 mg by mouth daily. Along with 50 mg      . metoprolol succinate (TOPROL-XL) 50 MG 24 hr tablet Take 50 mg by mouth daily. Take with or immediately following a meal, along with 25 mg      . mirtazapine (REMERON) 15 MG tablet Take 15 mg by mouth at bedtime.        . pantoprazole (PROTONIX) 40 MG tablet Take 40 mg by mouth daily.      . polyethylene glycol (MIRALAX / GLYCOLAX) packet Take 17 g by mouth daily.      . traZODone (DESYREL) 50 MG tablet Take 50 mg by mouth at bedtime.      Marland Kitchen warfarin (COUMADIN) 4 MG tablet Take 4mg  daily except 6mg  on Mondays, Wednesdays and Fridays Received call from [facility].  They need this label faxed to them for their record.  They have the pills, just need the actual dosage rather than follow previous instructions.  Thanks, Vashti Hey RN  45 tablet  2   No current facility-administered medications for  this visit.   Allergies  Allergen Reactions  . Codeine     REACTION: nausea and emesis     Past medical history, social history, and family history reviewed and updated.  ROS: Denies chest pain, palpitations, lightheadedness or syncope. She has no exertional dyspnea, orthopnea, PND nor ankle edema. All other systems reviewed and are negative.  PHYSICAL EXAM: BP 160/87  Pulse 78  Ht 5\' 6"  (1.676 m)  Wt 57.607 kg (127 lb)  BMI 20.51 kg/m2;  Body mass index is 20.51 kg/(m^2). General-Well developed; no acute distress Body habitus-thin Neck-No JVD; no carotid bruits Lungs-few inspiratory rhonchi; resonant to percussion Cardiovascular-normal PMI; normal S1 and S2; irregular rhythm; minimal systolic murmur Abdomen-normal bowel sounds; soft and non-tender without masses or organomegaly Musculoskeletal-No deformities, no cyanosis or clubbing Neurologic-Normal cranial nerves; symmetric strength and tone Skin-Warm, no significant lesions Extremities-distal pulses intact; no edema  [place] Bing, MD 08/19/2012  11:45 AM  ASSESSMENT AND PLAN

## 2012-08-19 NOTE — Assessment & Plan Note (Addendum)
Patient has tolerated warfarin therapy very well for many years. Accordingly, I am not inclined to switch to a novel oral agent. We will continue to monitor for occult GI blood loss.

## 2012-08-19 NOTE — Assessment & Plan Note (Signed)
No symptoms or other issues apparently related to atrial fibrillation. The strategy of control of heart rate and anticoagulation will not be changed.

## 2012-08-19 NOTE — Progress Notes (Deleted)
Name: Katherine Mckinney    DOB: Aug 29, 1924  Age: 79 y.o.  MR#: [id number]       PCP:  Carylon Perches, MD      Insurance: Payor: MEDICARE  Plan: MEDICARE PART A AND B  Product Type: *No Product type*    CC:   No chief complaint on file.  MEDICATION LIST LABS FROM PCP November ALONG WITH EKG  VS Filed Vitals:   08/19/12 1048  BP: 160/87  Pulse: 78  Height: 5\' 6"  (1.676 m)  Weight: 127 lb (57.607 kg)    Weights Current Weight  08/19/12 127 lb (57.607 kg)  08/14/11 117 lb (53.071 kg)  08/14/11 117 lb (53.071 kg)    Blood Pressure  BP Readings from Last 3 Encounters:  08/19/12 160/87  08/14/11 131/71  08/14/11 131/71     Admit date:  (Not on file) Last encounter with RMR:  Visit date not found   Allergy Codeine  Current Outpatient Prescriptions  Medication Sig Dispense Refill  . acetaminophen (TYLENOL) 500 MG tablet Take 1,000 mg by mouth 3 (three) times daily as needed. For pain/fever      . beta carotene w/minerals (OCUVITE) tablet Take 1 tablet by mouth daily.        . clonazePAM (KLONOPIN) 0.5 MG tablet Take 0.25-0.5 mg by mouth 3 (three) times daily. 0.25 mg  At 8 am and 12 pm and 0.5 mg at 4 pm      . DULoxetine (CYMBALTA) 30 MG capsule Take 40 mg by mouth daily.       Marland Kitchen levothyroxine (SYNTHROID, LEVOTHROID) 50 MCG tablet Take 50 mcg by mouth daily.      . metoprolol succinate (TOPROL-XL) 25 MG 24 hr tablet Take 25 mg by mouth daily. Along with 50 mg      . metoprolol succinate (TOPROL-XL) 50 MG 24 hr tablet Take 50 mg by mouth daily. Take with or immediately following a meal, along with 25 mg      . mirtazapine (REMERON) 15 MG tablet Take 15 mg by mouth at bedtime.        . pantoprazole (PROTONIX) 40 MG tablet Take 40 mg by mouth daily.      . polyethylene glycol (MIRALAX / GLYCOLAX) packet Take 17 g by mouth daily.      . traZODone (DESYREL) 50 MG tablet Take 50 mg by mouth at bedtime.      Marland Kitchen warfarin (COUMADIN) 4 MG tablet Take 4mg  daily except 6mg  on Mondays, Wednesdays and  Fridays Received call from [facility].  They need this label faxed to them for their record.  They have the pills, just need the actual dosage rather than follow previous instructions.  Thanks, Vashti Hey RN  45 tablet  2   No current facility-administered medications for this visit.    Discontinued Meds:   There are no discontinued medications.  Patient Active Problem List  Diagnosis  . HYPERLIPIDEMIA  . CARCINOMA, UTERUS, HX OF  . Atrial fibrillation  . Chronic anticoagulation  . Laboratory test  . Arteriosclerotic cardiovascular disease (ASCVD)  . Glaucoma    LABS    Component Value Date/Time   NA 141 07/30/2010 1419   NA 141 01/31/2010 1325   NA 143 01/11/2009   K 3.7 07/30/2010 1419   K 3.7 01/31/2010 1325   K 4.2 01/11/2009   CL 108 07/30/2010 1419   CL 108 01/31/2010 1325   CL 108 01/11/2009   CO2 24 07/30/2010 1419   CO2  24 01/31/2010 1325   CO2 24 01/11/2009   GLUCOSE 94 07/30/2010 1419   GLUCOSE 94 01/31/2010 1325   GLUCOSE 98 01/11/2009   BUN 23 07/30/2010 1419   BUN 23 01/31/2010 1325   BUN 16 01/11/2009   CREATININE 1.02 07/30/2010 1419   CREATININE 1.02 01/31/2010 1325   CREATININE 1.01 01/11/2009   CALCIUM 9.0 07/30/2010 1419   CALCIUM 9.4 01/11/2009   CMP     Component Value Date/Time   NA 141 07/30/2010 1419   K 3.7 07/30/2010 1419   CL 108 07/30/2010 1419   CO2 24 07/30/2010 1419   GLUCOSE 94 07/30/2010 1419   BUN 23 07/30/2010 1419   CREATININE 1.02 07/30/2010 1419   CALCIUM 9.0 07/30/2010 1419   PROT 6.5 07/30/2010 1419   ALBUMIN 3.9 07/30/2010 1419   AST 19 07/30/2010 1419   ALT 10 07/30/2010 1419   ALKPHOS 52 07/30/2010 1419       Component Value Date/Time   WBC 6.1 07/30/2010 1419   WBC 6.1 01/31/2010 1325   WBC 6.2 01/11/2009   HGB 13.6 07/30/2010 1419   HGB 13.6 01/31/2010 1325   HGB 14.2 01/11/2009   HCT 39.9 07/30/2010 1419   HCT 39.9 01/31/2010 1325   HCT 42.9 01/11/2009   MCV 91.7 07/30/2010 1419   MCV 91.7 01/31/2010 1325   MCV 91.3 01/11/2009    Lipid  Panel     Component Value Date/Time   CHOL 220 07/30/2010 1419   TRIG 119 07/30/2010 1419   HDL 42 07/30/2010 1419   LDLCALC 154 07/30/2010 1419    ABG No results found for this basename: phart, pco2, pco2art, po2, po2art, hco3, tco2, acidbasedef, o2sat     Lab Results  Component Value Date   TSH 1.738 07/30/2010   BNP (last 3 results) No results found for this basename: PROBNP,  in the last 8760 hours Cardiac Panel (last 3 results) No results found for this basename: CKTOTAL, CKMB, TROPONINI, RELINDX,  in the last 72 hours  Iron/TIBC/Ferritin No results found for this basename: iron, tibc, ferritin     EKG Orders placed in visit on 03/14/11  . EKG     Prior Assessment and Plan Problem List as of 08/19/2012     ICD-9-CM     Cardiology Problems   HYPERLIPIDEMIA   Atrial fibrillation   Arteriosclerotic cardiovascular disease (ASCVD)     Other   CARCINOMA, UTERUS, HX OF   Chronic anticoagulation   Laboratory test   Glaucoma       Imaging: No results found.

## 2012-08-19 NOTE — Patient Instructions (Addendum)
Your physician recommends that you schedule a follow-up appointment in: 1 year in [place] office

## 2012-08-19 NOTE — Assessment & Plan Note (Signed)
Long-standing moderate hyperlipidemia without a clear indication for pharmacologic therapy. Since coronary atherosclerosis was asymptomatic and minimal, in light of patient's advanced age, drug therapy will not be utilized unless a more compelling indication arises.

## 2012-08-25 ENCOUNTER — Ambulatory Visit (INDEPENDENT_AMBULATORY_CARE_PROVIDER_SITE_OTHER): Payer: Medicare Other | Admitting: *Deleted

## 2012-08-25 DIAGNOSIS — Z7901 Long term (current) use of anticoagulants: Secondary | ICD-10-CM

## 2012-08-25 DIAGNOSIS — I4891 Unspecified atrial fibrillation: Secondary | ICD-10-CM

## 2012-08-25 DIAGNOSIS — Z8679 Personal history of other diseases of the circulatory system: Secondary | ICD-10-CM

## 2012-08-28 ENCOUNTER — Telehealth: Payer: Self-pay | Admitting: *Deleted

## 2012-08-28 NOTE — Telephone Encounter (Signed)
Please call [facility]) needs to talk with Misty Stanley regarding instructions on Mrs. Balogh's coumdin. Please call [phone]

## 2012-08-28 NOTE — Telephone Encounter (Signed)
Spoke with Katherine Mckinney.  They would like for me to call [facility] and have warfarin changed to a 4mg  tablet and a 6mg  tablet.  Pt is taking 4mg  daily except 6mg  on M,W,F.  Katherine Mckinney states this will be easier for the staff to understand.  Spoke with Dorene Grebe PharmD and she will take care of this.

## 2012-09-29 ENCOUNTER — Ambulatory Visit: Payer: Self-pay | Admitting: *Deleted

## 2012-09-29 ENCOUNTER — Telehealth: Payer: Self-pay | Admitting: Cardiology

## 2012-09-29 ENCOUNTER — Other Ambulatory Visit: Payer: Self-pay | Admitting: Cardiology

## 2012-09-29 DIAGNOSIS — I4891 Unspecified atrial fibrillation: Secondary | ICD-10-CM

## 2012-09-29 DIAGNOSIS — Z7901 Long term (current) use of anticoagulants: Secondary | ICD-10-CM

## 2012-09-29 NOTE — Telephone Encounter (Signed)
Refill Request     Coumadin 6 mg

## 2012-09-29 NOTE — Telephone Encounter (Signed)
Spoke with Katherine Mckinney. At [facility].  Rx coumadin 6mg  tablet #12 x 6 refill called in.  They bubble pack her meds and they use 4mg  and 6mg  tablets

## 2012-10-06 ENCOUNTER — Ambulatory Visit (INDEPENDENT_AMBULATORY_CARE_PROVIDER_SITE_OTHER): Payer: Medicare Other | Admitting: *Deleted

## 2012-10-06 ENCOUNTER — Telehealth: Payer: Self-pay | Admitting: Cardiology

## 2012-10-06 DIAGNOSIS — I4891 Unspecified atrial fibrillation: Secondary | ICD-10-CM

## 2012-10-06 DIAGNOSIS — Z8679 Personal history of other diseases of the circulatory system: Secondary | ICD-10-CM

## 2012-10-06 DIAGNOSIS — Z7901 Long term (current) use of anticoagulants: Secondary | ICD-10-CM

## 2012-10-06 NOTE — Telephone Encounter (Signed)
New Prob      Requesting  Order stating pt needs to take extra WARFARIN be faxed to her. Fax number [phone].

## 2012-10-06 NOTE — Telephone Encounter (Signed)
Order faxed to [facility].

## 2012-10-08 ENCOUNTER — Telehealth: Payer: Self-pay | Admitting: *Deleted

## 2012-10-08 MED ORDER — WARFARIN SODIUM 4 MG PO TABS: 4.0000 mg | ORAL_TABLET | Freq: Every day | ORAL | Status: DC

## 2012-10-08 NOTE — Telephone Encounter (Signed)
Incoming fax of patient medication refill request noted per patient pharmacy. FROM [facility] FOR WARFARIN 4MG  rx sent to pharmacy by e-script

## 2012-10-27 ENCOUNTER — Ambulatory Visit (INDEPENDENT_AMBULATORY_CARE_PROVIDER_SITE_OTHER): Payer: Medicare Other | Admitting: *Deleted

## 2012-10-27 DIAGNOSIS — Z8679 Personal history of other diseases of the circulatory system: Secondary | ICD-10-CM

## 2012-10-27 DIAGNOSIS — Z7901 Long term (current) use of anticoagulants: Secondary | ICD-10-CM

## 2012-10-27 DIAGNOSIS — I4891 Unspecified atrial fibrillation: Secondary | ICD-10-CM

## 2012-10-27 LAB — POCT INR: INR: 2.9

## 2012-11-24 ENCOUNTER — Ambulatory Visit (INDEPENDENT_AMBULATORY_CARE_PROVIDER_SITE_OTHER): Payer: Medicare Other | Admitting: *Deleted

## 2012-11-24 DIAGNOSIS — Z7901 Long term (current) use of anticoagulants: Secondary | ICD-10-CM

## 2012-11-24 DIAGNOSIS — Z8679 Personal history of other diseases of the circulatory system: Secondary | ICD-10-CM

## 2012-11-24 DIAGNOSIS — I4891 Unspecified atrial fibrillation: Secondary | ICD-10-CM

## 2012-11-24 LAB — POCT INR: INR: 3.4

## 2012-12-04 ENCOUNTER — Telehealth: Payer: Self-pay

## 2012-12-04 MED ORDER — WARFARIN SODIUM 4 MG PO TABS: 4.0000 mg | ORAL_TABLET | Freq: Every day | ORAL | Status: DC

## 2012-12-04 NOTE — Telephone Encounter (Signed)
Patient request refill of WARFARIN 4 mg. Call into [facility] [phone]

## 2012-12-22 ENCOUNTER — Ambulatory Visit (INDEPENDENT_AMBULATORY_CARE_PROVIDER_SITE_OTHER): Payer: Medicare Other | Admitting: *Deleted

## 2012-12-22 DIAGNOSIS — Z7901 Long term (current) use of anticoagulants: Secondary | ICD-10-CM

## 2012-12-22 DIAGNOSIS — Z8679 Personal history of other diseases of the circulatory system: Secondary | ICD-10-CM

## 2012-12-22 DIAGNOSIS — I4891 Unspecified atrial fibrillation: Secondary | ICD-10-CM

## 2012-12-22 LAB — POCT INR: INR: 2.8

## 2013-01-19 ENCOUNTER — Ambulatory Visit (INDEPENDENT_AMBULATORY_CARE_PROVIDER_SITE_OTHER): Payer: Medicare Other | Admitting: *Deleted

## 2013-01-19 DIAGNOSIS — Z7901 Long term (current) use of anticoagulants: Secondary | ICD-10-CM

## 2013-01-19 DIAGNOSIS — I4891 Unspecified atrial fibrillation: Secondary | ICD-10-CM

## 2013-01-19 DIAGNOSIS — Z8679 Personal history of other diseases of the circulatory system: Secondary | ICD-10-CM

## 2013-02-10 ENCOUNTER — Other Ambulatory Visit: Payer: Self-pay | Admitting: *Deleted

## 2013-02-10 MED ORDER — WARFARIN SODIUM 4 MG PO TABS: 4.0000 mg | ORAL_TABLET | Freq: Every day | ORAL | Status: DC

## 2013-02-16 ENCOUNTER — Ambulatory Visit (INDEPENDENT_AMBULATORY_CARE_PROVIDER_SITE_OTHER): Payer: Medicare Other | Admitting: *Deleted

## 2013-02-16 DIAGNOSIS — I4891 Unspecified atrial fibrillation: Secondary | ICD-10-CM

## 2013-02-16 DIAGNOSIS — Z8679 Personal history of other diseases of the circulatory system: Secondary | ICD-10-CM

## 2013-02-16 DIAGNOSIS — Z7901 Long term (current) use of anticoagulants: Secondary | ICD-10-CM

## 2013-03-16 ENCOUNTER — Ambulatory Visit (INDEPENDENT_AMBULATORY_CARE_PROVIDER_SITE_OTHER): Payer: Medicare Other | Admitting: *Deleted

## 2013-03-16 DIAGNOSIS — Z8679 Personal history of other diseases of the circulatory system: Secondary | ICD-10-CM

## 2013-03-16 DIAGNOSIS — I4891 Unspecified atrial fibrillation: Secondary | ICD-10-CM

## 2013-03-16 DIAGNOSIS — Z7901 Long term (current) use of anticoagulants: Secondary | ICD-10-CM

## 2013-03-16 LAB — POCT INR: INR: 2.8

## 2013-04-23 ENCOUNTER — Encounter: Payer: Self-pay | Admitting: Cardiology

## 2013-04-27 ENCOUNTER — Ambulatory Visit (INDEPENDENT_AMBULATORY_CARE_PROVIDER_SITE_OTHER): Payer: Medicare Other | Admitting: *Deleted

## 2013-04-27 DIAGNOSIS — I4891 Unspecified atrial fibrillation: Secondary | ICD-10-CM

## 2013-04-27 DIAGNOSIS — Z8679 Personal history of other diseases of the circulatory system: Secondary | ICD-10-CM

## 2013-04-27 DIAGNOSIS — Z7901 Long term (current) use of anticoagulants: Secondary | ICD-10-CM

## 2013-04-27 LAB — POCT INR: INR: 2.6

## 2013-05-23 ENCOUNTER — Emergency Department (HOSPITAL_COMMUNITY): Payer: Medicare Other

## 2013-05-23 ENCOUNTER — Inpatient Hospital Stay (HOSPITAL_COMMUNITY)
Admission: EM | Admit: 2013-05-23 | Discharge: 2013-05-25 | DRG: 203 | Disposition: A | Discharge status: Nursing Home (Includes ICF) | Payer: Medicare Other | Attending: Internal Medicine | Admitting: Internal Medicine

## 2013-05-23 ENCOUNTER — Encounter (HOSPITAL_COMMUNITY): Payer: Self-pay | Admitting: Emergency Medicine

## 2013-05-23 DIAGNOSIS — R0902 Hypoxemia: Secondary | ICD-10-CM

## 2013-05-23 DIAGNOSIS — Z7901 Long term (current) use of anticoagulants: Secondary | ICD-10-CM

## 2013-05-23 DIAGNOSIS — E860 Dehydration: Secondary | ICD-10-CM | POA: Diagnosis present

## 2013-05-23 DIAGNOSIS — F329 Major depressive disorder, single episode, unspecified: Secondary | ICD-10-CM | POA: Diagnosis present

## 2013-05-23 DIAGNOSIS — Z8249 Family history of ischemic heart disease and other diseases of the circulatory system: Secondary | ICD-10-CM

## 2013-05-23 DIAGNOSIS — I2510 Atherosclerotic heart disease of native coronary artery without angina pectoris: Secondary | ICD-10-CM | POA: Diagnosis present

## 2013-05-23 DIAGNOSIS — F3289 Other specified depressive episodes: Secondary | ICD-10-CM | POA: Diagnosis present

## 2013-05-23 DIAGNOSIS — I4891 Unspecified atrial fibrillation: Secondary | ICD-10-CM | POA: Diagnosis present

## 2013-05-23 DIAGNOSIS — Z801 Family history of malignant neoplasm of trachea, bronchus and lung: Secondary | ICD-10-CM

## 2013-05-23 DIAGNOSIS — F411 Generalized anxiety disorder: Secondary | ICD-10-CM | POA: Diagnosis present

## 2013-05-23 DIAGNOSIS — Z8542 Personal history of malignant neoplasm of other parts of uterus: Secondary | ICD-10-CM

## 2013-05-23 DIAGNOSIS — J069 Acute upper respiratory infection, unspecified: Secondary | ICD-10-CM

## 2013-05-23 DIAGNOSIS — J40 Bronchitis, not specified as acute or chronic: Principal | ICD-10-CM | POA: Diagnosis present

## 2013-05-23 DIAGNOSIS — K219 Gastro-esophageal reflux disease without esophagitis: Secondary | ICD-10-CM | POA: Diagnosis present

## 2013-05-23 DIAGNOSIS — E039 Hypothyroidism, unspecified: Secondary | ICD-10-CM | POA: Diagnosis present

## 2013-05-23 LAB — CBC
HCT: 39.3 % (ref 36.0–46.0)
Hemoglobin: 13.2 g/dL (ref 12.0–15.0)
MCH: 30.3 pg (ref 26.0–34.0)
MCHC: 33.6 g/dL (ref 30.0–36.0)
MCV: 90.1 fL (ref 78.0–100.0)
PLATELETS: 420 10*3/uL — AB (ref 150–400)
RBC: 4.36 MIL/uL (ref 3.87–5.11)
RDW: 13.6 % (ref 11.5–15.5)
WBC: 9.1 10*3/uL (ref 4.0–10.5)

## 2013-05-23 LAB — URINALYSIS, ROUTINE W REFLEX MICROSCOPIC
Bilirubin Urine: NEGATIVE
GLUCOSE, UA: NEGATIVE mg/dL
HGB URINE DIPSTICK: NEGATIVE
Ketones, ur: NEGATIVE mg/dL
LEUKOCYTES UA: NEGATIVE
Nitrite: NEGATIVE
Protein, ur: NEGATIVE mg/dL
SPECIFIC GRAVITY, URINE: 1.02 (ref 1.005–1.030)
UROBILINOGEN UA: 0.2 mg/dL (ref 0.0–1.0)
pH: 7 (ref 5.0–8.0)

## 2013-05-23 LAB — BASIC METABOLIC PANEL
BUN: 18 mg/dL (ref 6–23)
CALCIUM: 9.9 mg/dL (ref 8.4–10.5)
CO2: 25 meq/L (ref 19–32)
Chloride: 101 mEq/L (ref 96–112)
Creatinine, Ser: 0.96 mg/dL (ref 0.50–1.10)
GFR calc Af Amer: 60 mL/min — ABNORMAL LOW (ref >90)
GFR calc non Af Amer: 52 mL/min — ABNORMAL LOW (ref >90)
Glucose, Bld: 100 mg/dL — ABNORMAL HIGH (ref 70–99)
Potassium: 4.2 mEq/L (ref 3.7–5.3)
Sodium: 139 mEq/L (ref 137–147)

## 2013-05-23 LAB — PROTIME-INR
INR: 4.08 — ABNORMAL HIGH (ref 0.00–1.49)
Prothrombin Time: 38 seconds — ABNORMAL HIGH (ref 11.6–15.2)

## 2013-05-23 MED ORDER — LEVOFLOXACIN IN D5W 500 MG/100ML IV SOLN
INTRAVENOUS | Status: AC
  Filled 2013-05-23: qty 100

## 2013-05-23 MED ORDER — LEVOFLOXACIN IN D5W 500 MG/100ML IV SOLN
500.0000 mg | INTRAVENOUS | Status: DC
  Administered 2013-05-23 – 2013-05-24 (×2): 500 mg via INTRAVENOUS
  Filled 2013-05-23 (×2): qty 100

## 2013-05-23 MED ORDER — PANTOPRAZOLE SODIUM 40 MG PO TBEC
40.0000 mg | DELAYED_RELEASE_TABLET | Freq: Every day | ORAL | Status: DC
  Filled 2013-05-23: qty 1

## 2013-05-23 MED ORDER — LEVOTHYROXINE SODIUM 50 MCG PO TABS
50.0000 ug | ORAL_TABLET | Freq: Every day | ORAL | Status: DC
  Administered 2013-05-24 – 2013-05-25 (×2): 50 ug via ORAL
  Filled 2013-05-23 (×2): qty 1

## 2013-05-23 MED ORDER — WARFARIN - PHARMACIST DOSING INPATIENT: Status: DC

## 2013-05-23 MED ORDER — POLYETHYLENE GLYCOL 3350 17 G PO PACK
17.0000 g | PACK | Freq: Every day | ORAL | Status: DC
  Filled 2013-05-23: qty 1

## 2013-05-23 MED ORDER — ACETAMINOPHEN 325 MG PO TABS: 650.0000 mg | ORAL_TABLET | Freq: Four times a day (QID) | ORAL | Status: DC | PRN

## 2013-05-23 MED ORDER — METOPROLOL SUCCINATE ER 25 MG PO TB24
25.0000 mg | ORAL_TABLET | Freq: Every day | ORAL | Status: DC
  Administered 2013-05-24 – 2013-05-25 (×2): 25 mg via ORAL
  Filled 2013-05-23 (×2): qty 1

## 2013-05-23 MED ORDER — TRAZODONE HCL 50 MG PO TABS
50.0000 mg | ORAL_TABLET | Freq: Every day | ORAL | Status: DC
  Administered 2013-05-24: 50 mg via ORAL
  Filled 2013-05-23: qty 1

## 2013-05-23 MED ORDER — ACETAMINOPHEN 650 MG RE SUPP: 650.0000 mg | Freq: Four times a day (QID) | RECTAL | Status: DC | PRN

## 2013-05-23 MED ORDER — ONDANSETRON HCL 4 MG PO TABS: 4.0000 mg | ORAL_TABLET | Freq: Four times a day (QID) | ORAL | Status: DC | PRN

## 2013-05-23 MED ORDER — ONDANSETRON HCL 4 MG/2ML IJ SOLN: 4.0000 mg | Freq: Four times a day (QID) | INTRAMUSCULAR | Status: DC | PRN

## 2013-05-23 MED ORDER — DULOXETINE HCL 20 MG PO CPEP
40.0000 mg | ORAL_CAPSULE | Freq: Every day | ORAL | Status: DC
  Filled 2013-05-23 (×2): qty 2

## 2013-05-23 MED ORDER — WARFARIN - PHARMACIST DOSING INPATIENT: Freq: Once | Status: DC

## 2013-05-23 MED ORDER — OCUVITE PO TABS
1.0000 | ORAL_TABLET | Freq: Every day | ORAL | Status: DC
  Administered 2013-05-24 – 2013-05-25 (×2): 1 via ORAL
  Filled 2013-05-23 (×4): qty 1

## 2013-05-23 MED ORDER — BENZONATATE 100 MG PO CAPS: 200.0000 mg | ORAL_CAPSULE | Freq: Three times a day (TID) | ORAL | Status: DC | PRN

## 2013-05-23 MED ORDER — ALBUTEROL SULFATE (2.5 MG/3ML) 0.083% IN NEBU
5.0000 mg | INHALATION_SOLUTION | Freq: Once | RESPIRATORY_TRACT | Status: AC
  Administered 2013-05-23: 5 mg via RESPIRATORY_TRACT
  Filled 2013-05-23: qty 6

## 2013-05-23 MED ORDER — MIRTAZAPINE 30 MG PO TABS
15.0000 mg | ORAL_TABLET | Freq: Every day | ORAL | Status: DC
  Administered 2013-05-23 – 2013-05-24 (×2): 15 mg via ORAL
  Filled 2013-05-23 (×2): qty 1

## 2013-05-23 MED ORDER — METOPROLOL SUCCINATE ER 50 MG PO TB24
50.0000 mg | ORAL_TABLET | Freq: Every day | ORAL | Status: DC
  Administered 2013-05-24 – 2013-05-25 (×2): 50 mg via ORAL
  Filled 2013-05-23 (×2): qty 1

## 2013-05-23 MED ORDER — ALUM & MAG HYDROXIDE-SIMETH 200-200-20 MG/5ML PO SUSP: 30.0000 mL | Freq: Four times a day (QID) | ORAL | Status: DC | PRN

## 2013-05-23 MED ORDER — SODIUM CHLORIDE 0.9 % IV SOLN
INTRAVENOUS | Status: DC
  Administered 2013-05-23 – 2013-05-24 (×2): via INTRAVENOUS

## 2013-05-23 NOTE — ED Provider Notes (Signed)
CSN: [id number]     Arrival date & time 05/23/13  1240 History   First MD Initiated Contact with Patient 05/23/13 1522     Chief Complaint  Patient presents with  . Cough  . Fatigue   (Consider location/radiation/quality/duration/timing/severity/associated sxs/prior Treatment) Patient is a 80 y.o. female presenting with cough. The history is provided by the patient.  Cough Cough characteristics:  Productive Associated symptoms: shortness of breath   Associated symptoms: no chest pain, no headaches and no rash    patient has had a cough and fatigue for the last 2 weeks. Has had azithromycin and then started a course of Levaquin of which she has had 1 dose. She states she's had a cough with white sputum. She's had shortness of breath. She's had fatigue. She states she gets so weak that she cannot go out of bed sometimes. No dysuria. No chest pain. No headache. No confusion. She has hypothyroidism, but states her levels have been good. No chest pain  Past Medical History  Diagnosis Date  . Chronic atrial fibrillation   . Hyperlipemia   . Arteriosclerotic cardiovascular disease (ASCVD) 04/1998    Minimal cardiac catheterization in 04/1998  . Uterine carcinoma 2002    TAH/BSO in 07/2000  . Glaucoma   . Depression with anxiety   . Hypothyroidism     07/2010: TSH-1.7  . Hx of gastroesophageal reflux (GERD) 2013    Esophageal dilatation for stricture   Past Surgical History  Procedure Laterality Date  . Tonsillectomy    . Appendectomy    . Breast excisional biopsy  1997    Right  . Total abdominal hysterectomy w/ bilateral salpingoophorectomy  07/2000    Neoplastic disease  . Colonoscopy  01/23/07    diminutive rectal polyp/scattered sigmoid diverticula/tubular adenoma. Due 01/2012  . Esophagogastroduodenoscopy  08/14/2011    Procedure: ESOPHAGOGASTRODUODENOSCOPY (EGD);  Surgeon: Daneil Dolin, MD;  [facility]: [facility];  Service: Endoscopy;  Laterality: N/A;  11:15  . Maloney  dilation  08/14/2011    Procedure: Venia Minks DILATION;  Surgeon: Daneil Dolin, MD;  Location: [facility];  Service: Endoscopy;  Laterality: N/A;   Family History  Problem Relation Age of Onset  . Heart attack Mother     H/o CAD in 78 brothers as well  . Lung cancer Father     Pneumoconiosis  . Coronary artery disease Brother     +3 other brothers, 2 with acute MI   History  Substance Use Topics  . Smoking status: Never Smoker   . Smokeless tobacco: Not on file  . Alcohol Use: No   OB History   Grav Para Term Preterm Abortions TAB SAB Ect Mult Living                 Review of Systems  Constitutional: Positive for appetite change and fatigue. Negative for activity change.  Eyes: Negative for pain.  Respiratory: Positive for cough and shortness of breath. Negative for chest tightness.   Cardiovascular: Negative for chest pain and leg swelling.  Gastrointestinal: Negative for nausea, vomiting, abdominal pain and diarrhea.  Genitourinary: Negative for flank pain.  Musculoskeletal: Negative for back pain and neck stiffness.  Skin: Negative for rash.  Neurological: Negative for weakness, numbness and headaches.  Psychiatric/Behavioral: Negative for behavioral problems.    Allergies  Codeine  Home Medications   Current Outpatient Rx  Name  Route  Sig  Dispense  Refill  . levothyroxine (SYNTHROID, LEVOTHROID) 50 MCG tablet  Oral   Take 50 mcg by mouth daily.         . metoprolol succinate (TOPROL-XL) 25 MG 24 hr tablet   Oral   Take 25 mg by mouth daily. Along with 50 mg         . metoprolol succinate (TOPROL-XL) 50 MG 24 hr tablet   Oral   Take 50 mg by mouth daily. Take with or immediately following a meal, along with 25 mg         . mirtazapine (REMERON) 15 MG tablet   Oral   Take 15 mg by mouth at bedtime.           Marland Kitchen acetaminophen (TYLENOL) 500 MG tablet   Oral   Take 1,000 mg by mouth 3 (three) times daily as needed. For pain/fever         .  beta carotene w/minerals (OCUVITE) tablet   Oral   Take 1 tablet by mouth daily.           . DULoxetine (CYMBALTA) 30 MG capsule   Oral   Take 40 mg by mouth daily.          . pantoprazole (PROTONIX) 40 MG tablet   Oral   Take 40 mg by mouth daily.         . polyethylene glycol (MIRALAX / GLYCOLAX) packet   Oral   Take 17 g by mouth daily.         . traZODone (DESYREL) 50 MG tablet   Oral   Take 50 mg by mouth at bedtime.         Marland Kitchen warfarin (COUMADIN) 4 MG tablet   Oral   Take 1 tablet (4 mg total) by mouth daily. Take 4mg  on Sundays, Tuesdays, Thursdays and Saturdays   30 tablet   1   . warfarin (COUMADIN) 6 MG tablet   Oral   Take 6 mg by mouth once. Take 6mg  on Mondays, Wednesdays and Fridays          BP 110/69  Pulse 79  Temp(Src) 98.4 F (36.9 C) (Oral)  Resp 18  Ht 5\' 4"  (1.626 m)  Wt 125 lb (56.7 kg)  BMI 21.45 kg/m2  SpO2 97% Physical Exam  Nursing note reviewed. Constitutional: She is oriented to person, place, and time. She appears well-developed and well-nourished.  HENT:  Head: Normocephalic and atraumatic.  Eyes: EOM are normal. Pupils are equal, round, and reactive to light.  Neck: Normal range of motion. Neck supple.  Cardiovascular: Normal rate and normal heart sounds.   No murmur heard. Pulmonary/Chest: No respiratory distress. She has no wheezes. She has no rales.  Mild dyspnea. Diffuse wheezes and prolonged expirations.  Abdominal: Soft. Bowel sounds are normal. She exhibits no distension. There is no tenderness. There is no rebound and no guarding.  Musculoskeletal: Normal range of motion.  Neurological: She is alert and oriented to person, place, and time. No cranial nerve deficit.  Skin: Skin is warm and dry.  Psychiatric: She has a normal mood and affect. Her speech is normal.    ED Course  Procedures (including critical care time) Labs Review Labs Reviewed  CBC - Abnormal; Notable for the following:    Platelets 420  (*)    All other components within normal limits  BASIC METABOLIC PANEL - Abnormal; Notable for the following:    Glucose, Bld 100 (*)    GFR calc non Af Amer 52 (*)    GFR calc Af  Amer 60 (*)    All other components within normal limits  PROTIME-INR - Abnormal; Notable for the following:    Prothrombin Time 38.0 (*)    INR 4.08 (*)    All other components within normal limits  URINALYSIS, ROUTINE W REFLEX MICROSCOPIC   Imaging Review Dg Chest 2 View  05/23/2013   CLINICAL DATA:  Cough, congestion and fatigue for 2 weeks.  EXAM: CHEST  2 VIEW  COMPARISON:  None.  FINDINGS: Lungs are hyperexpanded with mild flattening of the hemidiaphragms on the lateral view. There is no focal consolidation or effusion. There is mild to moderate cardiomegaly. Remainder the exam is unremarkable.  IMPRESSION: No active cardiopulmonary disease.  Mild emphysematous disease and moderate cardiomegaly.   Electronically Signed   By: Marin Olp M.D.   On: 05/23/2013 13:53    EKG Interpretation    Date/Time:  Sunday May 23 2013 15:36:56 EST Ventricular Rate:  89 PR Interval:    QRS Duration: 68 QT Interval:  378 QTC Calculation: 459 R Axis:   76 Text Interpretation:  Atrial fibrillation Anteroseptal infarct (cited on or before 18-Apr-1998) Abnormal ECG When compared with ECG of 20-Mar-2002 20:01, Questionable change in initial forces of Anterior leads Nonspecific T wave abnormality has replaced inverted T waves in Inferior leads Confirmed by POLLINA  MD, CHRISTOPHER ([id number]) on 05/23/2013 3:57:56 PM            MDM  No diagnosis found. Patient with shortness of breath and cough. Has been going for 2 weeks. Has been on 2 different courses of antibiotics. X-ray does not show pneumonia. Emphysema on x-ray, although patient was not a smoker. Patient will have a saturation of 90% with ambulation. She is not on oxygen at home. Will admit to internal medicine    Jasper Riling. Alvino Chapel, MD 05/23/13 1743

## 2013-05-23 NOTE — ED Notes (Signed)
Pt states, "I just feel bad all over. I can barely hold my head up, I'm so weak sometimes." Pt is currently being treated with Levoquin after being treated with other antibiotic. Pt states cough is a little better but she does not feel well. Decreased appetite. Resident of [facility] at home.

## 2013-05-23 NOTE — H&P (Signed)
H & P dictated

## 2013-05-23 NOTE — ED Notes (Signed)
Dr. Alvino Chapel at bedside speaking with family and pt

## 2013-05-23 NOTE — ED Notes (Signed)
Pt ate very little of her dinner tray,

## 2013-05-23 NOTE — ED Notes (Signed)
Pt is a resident of [facility] in [place] who presented to er today for further evaluation of cough, weakness, states that she was seen by Dr. Willey Blade on Friday, placed on Levaquin, after having taken a z-pack last week, states that her cough is better but still feels weak.

## 2013-05-23 NOTE — ED Notes (Signed)
Report given to the floor,   

## 2013-05-23 NOTE — ED Notes (Signed)
Patient ambulated to restroom.  o2 sat dropped to 90% while walking - patient was shaking but this could have been due to neb treatments.  Patient stated she felt "very weak".

## 2013-05-23 NOTE — ED Notes (Signed)
Attempted to walk with pt to restroom, pt weak, shakey, pt unsteady on her feet, pt assited back to room, used bedside commode instead, tolerated well, pt sitting on side of bed, eating dinner tray,

## 2013-05-24 ENCOUNTER — Telehealth: Payer: Self-pay

## 2013-05-24 ENCOUNTER — Telehealth: Payer: Self-pay | Admitting: *Deleted

## 2013-05-24 LAB — PROTIME-INR
INR: 4.36 — AB (ref 0.00–1.49)
PROTHROMBIN TIME: 40 s — AB (ref 11.6–15.2)

## 2013-05-24 MED ORDER — WARFARIN SODIUM 4 MG PO TABS: 4.0000 mg | ORAL_TABLET | Freq: Every day | ORAL | Status: DC

## 2013-05-24 MED ORDER — WARFARIN SODIUM 6 MG PO TABS: ORAL_TABLET | ORAL | Status: DC

## 2013-05-24 MED ORDER — DULOXETINE HCL 60 MG PO CPEP
60.0000 mg | ORAL_CAPSULE | Freq: Every day | ORAL | Status: DC
  Administered 2013-05-24 – 2013-05-25 (×2): 60 mg via ORAL
  Filled 2013-05-24: qty 1

## 2013-05-24 NOTE — Progress Notes (Signed)
Katherine Mckinney, Katherine Mckinney NO.:  [id number]  MEDICAL RECORD NO.:  [id number]  LOCATION:  W409                          FACILITY:  [facility]  PHYSICIAN:  Paula Compton. Willey Blade, MD       DATE OF BIRTH:  [birth year]  DATE OF PROCEDURE: DATE OF DISCHARGE:                                PROGRESS NOTE   [name] was admitted last night by Dr. Luan Pulling after presenting with bronchitis and weakness.  Chest x-ray revealed no infiltrates.  She is clinically dehydrated appearing and was treated with IV fluids.  She is accompanied by her daughter and son-in-law this morning.  There is concern that she is becoming more depressed and anxious again, despite Cymbalta and Remeron.  She states her cough is better.  OBJECTIVE:  VITAL SIGNS:  Temperature 98.1, pulse 73, blood pressure 128/71, her oxygen saturation had dipped to 90 yesterday and is now 100 on supplemental oxygen. LUNGS:  Clear. HEART:  Irregularly irregular. ABDOMEN:  Soft and nontender. EXTREMITIES:  Reveal no edema.  IMPRESSION AND PLAN: 1. Bronchitis.  Continue Levaquin. 2. Depression and anxiety.  Increase Cymbalta to 60 mg daily. 3. Dehydration.  Continue IV fluids. 4. Chronic atrial fibrillation.  She is over anticoagulated with an     INR of 4.36.  Her INR was 4.08 yesterday.  Pharmacy has been     consulted for Coumadin management.  Her chronic Coumadin management     is through [facility].  Her rate control is stable with     Toprol XL.     Paula Compton. Willey Blade, MD     ROF/MEDQ  D:  05/24/2013  T:  05/24/2013  Job:  [id number]

## 2013-05-24 NOTE — Telephone Encounter (Signed)
Received fax refill request  Rx # [id number] Medication:  Warfarin 6mg  Qty 13 Sig:  Take 1 tablet by mouth 3 times weekly on Monday, Wednesday and Friday-Emergency Refill faxed Dr. Physician:  Harl Bowie

## 2013-05-24 NOTE — Progress Notes (Signed)
Utilization Review Complete  

## 2013-05-24 NOTE — Progress Notes (Signed)
ANTICOAGULATION CONSULT NOTE - Initial Consult  Pharmacy Consult for Coumadin (chronic RX PTA) Indication: atrial fibrillation  Allergies  Allergen Reactions  . Codeine     REACTION: nausea and emesis   Patient Measurements: Height: 5\' 4"  (162.6 cm) Weight: 128 lb 12.8 oz (58.423 kg) IBW/kg (Calculated) : 54.7  Vital Signs: Temp: 98.1 F (36.7 C) (01/05 0649) BP: 128/71 mmHg (01/05 0649) Pulse Rate: 73 (01/05 0649)  Labs:  Recent Labs  05/23/13 1332 05/24/13 0614  HGB 13.2  --   HCT 39.3  --   PLT 420*  --   LABPROT 38.0* 40.0*  INR 4.08* 4.36*  CREATININE 0.96  --    Estimated Creatinine Clearance: 36.3 ml/min (by C-G formula based on Cr of 0.96).  Medical History: Past Medical History  Diagnosis Date  . Chronic atrial fibrillation   . Hyperlipemia   . Arteriosclerotic cardiovascular disease (ASCVD) 04/1998    Minimal cardiac catheterization in 04/1998  . Uterine carcinoma 2002    TAH/BSO in 07/2000  . Glaucoma   . Depression with anxiety   . Hypothyroidism     07/2010: TSH-1.7  . Hx of gastroesophageal reflux (GERD) 2013    Esophageal dilatation for stricture   Medications:  Prescriptions prior to admission  Medication Sig Dispense Refill  . levothyroxine (SYNTHROID, LEVOTHROID) 50 MCG tablet Take 50 mcg by mouth daily.      . metoprolol succinate (TOPROL-XL) 25 MG 24 hr tablet Take 25 mg by mouth daily. Along with 50 mg      . metoprolol succinate (TOPROL-XL) 50 MG 24 hr tablet Take 50 mg by mouth daily. Take with or immediately following a meal, along with 25 mg      . mirtazapine (REMERON) 15 MG tablet Take 15 mg by mouth at bedtime.        Marland Kitchen acetaminophen (TYLENOL) 500 MG tablet Take 1,000 mg by mouth 3 (three) times daily as needed. For pain/fever      . beta carotene w/minerals (OCUVITE) tablet Take 1 tablet by mouth daily.        . DULoxetine (CYMBALTA) 30 MG capsule Take 40 mg by mouth daily.       . traZODone (DESYREL) 50 MG tablet Take 50 mg by  mouth at bedtime.      Marland Kitchen warfarin (COUMADIN) 4 MG tablet Take 1 tablet (4 mg total) by mouth daily. Take 4mg  on Sundays, Tuesdays, Thursdays and Saturdays  30 tablet  1   Assessment: 80yo female on chronic Coumadin PTA for h/o afib.  INR is supratherapeutic on admission.  Home dose listed above.   Coumadin was held last night due to elevated INR.  Goal of Therapy:  INR 2-3 Monitor platelets by anticoagulation protocol: Yes   Plan:  HOLD coumadin today INR daily  [place] Crane, Lottie Siska A 05/24/2013,11:34 AM

## 2013-05-24 NOTE — Telephone Encounter (Signed)
Received incoming call from [facility] to confirm pt is taking both the Coumadin 6mg  and 4mg  tablets, advised pt had prescription sent in today 05-24-13 from [facility] RN via Dr. Bronson Ing for the refill on the 6mg  tablets, advised verbal order to [facility] for coumadin 4mg  take as directed 30 tablets with 3 refills per pt up to date with apt and noted directions for coumadin as of check noted on 04-27-2013 as follows:  Anticoagulation Dose Instructions as of 04/27/2013      Total Sun Mon Tue Wed Thu Fri Sat   New Dose 34 mg 4 mg 6 mg 4 mg 6 mg 4 mg 6 mg 4 mg     (4 mg x 1)  -  (4 mg x 1)  -  (4 mg x 1)  -  (4 mg x 1)      -  (6 mg x 1)  -  (6 mg x 1)  -  (6 mg x 1)  -               Amanda accepted phone refill for the pt, placed in chart as phone in

## 2013-05-24 NOTE — H&P (Signed)
NAMELILEY, RAKE NO.:  [id number]  MEDICAL RECORD NO.:  [id number]  LOCATION:  O130                          FACILITY:  [facility]  PHYSICIAN:  Leroy Pettway L. Luan Pulling, M.D.DATE OF BIRTH:  April 14, 1925  DATE OF ADMISSION:  05/23/2013 DATE OF DISCHARGE:  LH                             HISTORY & PHYSICAL   Patient of Dr. Willey Blade.  REASON FOR ADMISSION:  Bronchitis and weakness.  HISTORY:  The patient is an 80 year old, who lives in an assisted living facility.  She had been in her usual state of health, but for the last 2 weeks, she has had cough and increasing fatigue.  She took Zithromax, but that did not seem to be successful, so she has started on Levaquin. She has been coughing up white sputum.  She has not had any fever or aching.  She says she has had some shortness of breath and fatigue and she has begun so weak that she cannot get out of bed.  She has not had chest pain, headache, or confusion.  She has not had any hemoptysis. She was admitted because when she tried ambulating in the emergency room she appeared to be very weak and in danger of falling and her oxygen saturation dropped from 98% to 90% almost immediately.  PAST MEDICAL HISTORY:  Positive for chronic atrial fibrillation, atherosclerotic cardiovascular disease, history of uterine cancer, glaucoma, anxiety and depression, hypothyroidism and GERD.  PAST SURGICAL HISTORY:  Surgically, she has had a tonsillectomy, appendectomy, breast biopsy, hysterectomy with bilateral salpingo- oophorectomy, colonoscopy, EGD, and a Maloney dilatation.  FAMILY HISTORY:  Positive for heart disease in her mother and in 4 brothers, lung cancer in her father.  SOCIAL HISTORY:  She lives at an assisted living facility.  She does not smoke.  She does not use any alcohol.  REVIEW OF SYSTEMS:  Except as mentioned is essentially negative.  She has not had any nausea or vomiting.  MEDS:  At home are Synthroid 50 mcg daily,  metoprolol 25 and 50 mg 2 together daily for a total of 75 mg, Remeron 15 mg at bedtime, Tylenol 1000 mg t.i.d. as needed, Ocuvite 1 daily, Cymbalta 40 mg daily, Protonix 40 mg daily, MiraLAX 17 g daily, trazodone 50 mg at bedtime, Coumadin the dose varies between 4 mg and 6 mg.  PHYSICAL EXAMINATION:  GENERAL:  Shows an elderly female, who appears to be acutely sick. VITAL SIGNS:  Her temperature is 98.4, pulse 84, respirations 20, blood pressure 151/71, and O2 sats 99%. HEENT:  Her pupils are reactive.  Nose and throat are clear.  Mucous membranes are slightly dry. NECK:  Supple without masses. CHEST:  With some rhonchi and wheezes and she had sounds "tight." HEART:  Irregular. ABDOMEN:  Soft. EXTREMITIES:  No edema. CENTRAL NERVOUS SYSTEM:  Grossly intact.  LABORATORY WORK:  Essentially nonrevealing except she has a slightly elevated platelet count, and her INR is 4.08.  Chest x-ray does not show a pneumonia.  EKG shows atrial fib.  ASSESSMENT:  She has what I think is probably acute bronchitis.  PLAN:  Plan is to treat her with antibiotics IV, give her gentle hydration.  Continue with her other treatments and follow.     Mateen Franssen L. Luan Pulling, M.D.     ELH/MEDQ  D:  05/23/2013  T:  05/24/2013  Job:  [id number]

## 2013-05-25 MED ORDER — DULOXETINE HCL 60 MG PO CPEP: 60.0000 mg | ORAL_CAPSULE | Freq: Every day | ORAL | Status: DC

## 2013-05-25 NOTE — Progress Notes (Signed)
D/c instructions reviewed with patient and son.  Verbalized understanding.  Pt dc'd with son to [facility] in [place]. Schonewitz, Eulis Canner 05/25/2013

## 2013-05-26 NOTE — Discharge Summary (Signed)
[name], Katherine Mckinney NO.:  [id number]  MEDICAL RECORD NO.:  [id number]  LOCATION:  L937                          FACILITY:  [facility]  PHYSICIAN:  Paula Compton. Willey Blade, MD       DATE OF BIRTH:  1924/09/05  DATE OF ADMISSION:  05/23/2013 DATE OF DISCHARGE:  01/06/2015LH                              DISCHARGE SUMMARY   DISCHARGE DIAGNOSES: 1. Bronchitis. 2. Dehydration. 3. Depression and anxiety. 4. Chronic atrial fibrillation. 5. Hypothyroidism.  DISCHARGE MEDICATIONS: 1. Synthroid 50 mcg daily. 2. Toprol-XL 75 mg daily. 3. Remeron 15 mg at bedtime. 4. Acetaminophen 1000 mg t.i.d. p.r.n. 5. Ocuvite daily. 6. Cymbalta 60 mg daily. 7. Levaquin 500 mg daily to complete her current supply. 8. Coumadin 4 mg on Sundays, Tuesdays, Thursdays, and Saturdays; 6 mg     on Mondays, Wednesdays, and Fridays.  HOSPITAL COURSE:  This patient is an 80 year old female who presented on March 23, 2013, with cough, fatigue, and dehydration.  She was treated with IV fluids and levofloxacin.  Her white count was 9.1.  Her chest x- ray revealed no acute infiltrate.  She was over anticoagulated on Coumadin with INRs of 4.08 and 4.36.  She had recently experienced increased signs and symptoms of depression and anxiety.  She has been treated with Cymbalta and mirtazapine. Cymbalta was increased to 60 mg daily.  Arrangements will be made for followup psychiatric consultation as an outpatient.  Her bronchitis and dehydration improved.  She was stable for discharge on the 6th.  Coumadin will need to be held today.  She will have follow up through the Coumadin Clinic with [facility].  She will be seen in followup in my office in 1 week.  She will finish her antibiotic course of Levaquin.     Paula Compton. Willey Blade, MD     ROF/MEDQ  D:  05/25/2013  T:  05/26/2013  Job:  [id number]

## 2013-05-28 ENCOUNTER — Ambulatory Visit (INDEPENDENT_AMBULATORY_CARE_PROVIDER_SITE_OTHER): Payer: Medicare Other | Admitting: *Deleted

## 2013-05-28 DIAGNOSIS — Z7901 Long term (current) use of anticoagulants: Secondary | ICD-10-CM

## 2013-05-28 DIAGNOSIS — Z8679 Personal history of other diseases of the circulatory system: Secondary | ICD-10-CM

## 2013-05-28 DIAGNOSIS — I4891 Unspecified atrial fibrillation: Secondary | ICD-10-CM

## 2013-05-28 LAB — POCT INR: INR: 3.3

## 2013-06-04 ENCOUNTER — Ambulatory Visit (INDEPENDENT_AMBULATORY_CARE_PROVIDER_SITE_OTHER): Payer: Medicare Other | Admitting: *Deleted

## 2013-06-04 DIAGNOSIS — Z8679 Personal history of other diseases of the circulatory system: Secondary | ICD-10-CM

## 2013-06-04 DIAGNOSIS — Z7901 Long term (current) use of anticoagulants: Secondary | ICD-10-CM

## 2013-06-04 DIAGNOSIS — I4891 Unspecified atrial fibrillation: Secondary | ICD-10-CM

## 2013-06-04 LAB — POCT INR: INR: 3.8

## 2013-06-22 ENCOUNTER — Ambulatory Visit (INDEPENDENT_AMBULATORY_CARE_PROVIDER_SITE_OTHER): Payer: Medicare Other | Admitting: *Deleted

## 2013-06-22 DIAGNOSIS — Z7901 Long term (current) use of anticoagulants: Secondary | ICD-10-CM

## 2013-06-22 DIAGNOSIS — I4891 Unspecified atrial fibrillation: Secondary | ICD-10-CM

## 2013-06-22 DIAGNOSIS — Z5181 Encounter for therapeutic drug level monitoring: Secondary | ICD-10-CM

## 2013-06-22 DIAGNOSIS — Z8679 Personal history of other diseases of the circulatory system: Secondary | ICD-10-CM

## 2013-06-22 LAB — POCT INR: INR: 5.2

## 2013-06-29 ENCOUNTER — Ambulatory Visit (INDEPENDENT_AMBULATORY_CARE_PROVIDER_SITE_OTHER): Payer: Medicare Other | Admitting: *Deleted

## 2013-06-29 DIAGNOSIS — Z8679 Personal history of other diseases of the circulatory system: Secondary | ICD-10-CM

## 2013-06-29 DIAGNOSIS — I4891 Unspecified atrial fibrillation: Secondary | ICD-10-CM

## 2013-06-29 DIAGNOSIS — Z7901 Long term (current) use of anticoagulants: Secondary | ICD-10-CM

## 2013-06-29 DIAGNOSIS — Z5181 Encounter for therapeutic drug level monitoring: Secondary | ICD-10-CM

## 2013-06-29 LAB — POCT INR: INR: 1.9

## 2013-07-20 ENCOUNTER — Ambulatory Visit (INDEPENDENT_AMBULATORY_CARE_PROVIDER_SITE_OTHER): Payer: Medicare Other | Admitting: *Deleted

## 2013-07-20 DIAGNOSIS — Z7901 Long term (current) use of anticoagulants: Secondary | ICD-10-CM

## 2013-07-20 DIAGNOSIS — I4891 Unspecified atrial fibrillation: Secondary | ICD-10-CM

## 2013-07-20 DIAGNOSIS — Z5181 Encounter for therapeutic drug level monitoring: Secondary | ICD-10-CM

## 2013-07-20 DIAGNOSIS — Z8679 Personal history of other diseases of the circulatory system: Secondary | ICD-10-CM

## 2013-07-20 LAB — POCT INR: INR: 2

## 2013-08-17 ENCOUNTER — Ambulatory Visit (INDEPENDENT_AMBULATORY_CARE_PROVIDER_SITE_OTHER): Payer: Medicare Other | Admitting: *Deleted

## 2013-08-17 DIAGNOSIS — I4891 Unspecified atrial fibrillation: Secondary | ICD-10-CM

## 2013-08-17 DIAGNOSIS — Z7901 Long term (current) use of anticoagulants: Secondary | ICD-10-CM

## 2013-08-17 DIAGNOSIS — Z5181 Encounter for therapeutic drug level monitoring: Secondary | ICD-10-CM

## 2013-08-17 DIAGNOSIS — Z8679 Personal history of other diseases of the circulatory system: Secondary | ICD-10-CM

## 2013-08-17 LAB — POCT INR: INR: 1.7

## 2013-09-07 ENCOUNTER — Ambulatory Visit (INDEPENDENT_AMBULATORY_CARE_PROVIDER_SITE_OTHER): Payer: Medicare Other | Admitting: *Deleted

## 2013-09-07 DIAGNOSIS — Z5181 Encounter for therapeutic drug level monitoring: Secondary | ICD-10-CM

## 2013-09-07 DIAGNOSIS — I4891 Unspecified atrial fibrillation: Secondary | ICD-10-CM

## 2013-09-07 DIAGNOSIS — Z7901 Long term (current) use of anticoagulants: Secondary | ICD-10-CM

## 2013-09-07 DIAGNOSIS — Z8679 Personal history of other diseases of the circulatory system: Secondary | ICD-10-CM

## 2013-09-07 LAB — POCT INR: INR: 2.1

## 2013-09-14 ENCOUNTER — Ambulatory Visit (INDEPENDENT_AMBULATORY_CARE_PROVIDER_SITE_OTHER): Payer: Medicare Other | Admitting: Cardiology

## 2013-09-14 ENCOUNTER — Encounter: Payer: Self-pay | Admitting: Cardiology

## 2013-09-14 VITALS — BP 145/85 | HR 70 | Ht 65.0 in | Wt 124.0 lb

## 2013-09-14 DIAGNOSIS — I2510 Atherosclerotic heart disease of native coronary artery without angina pectoris: Secondary | ICD-10-CM

## 2013-09-14 DIAGNOSIS — I4821 Permanent atrial fibrillation: Secondary | ICD-10-CM

## 2013-09-14 DIAGNOSIS — I4891 Unspecified atrial fibrillation: Secondary | ICD-10-CM

## 2013-09-14 NOTE — Progress Notes (Signed)
Clinical Summary Katherine Mckinney is an 80 y.o.female presenting for routine cardiac followup, her last visit being with Dr. Lattie Mckinney in April 2014. This is our first meeting in the office. She lives in [place]. From a cardiac perspective, no major symptoms of progressive palpitations or chest pain. She reports having trouble with her vision and also problems with depression. She was anxious in describing her limitations today.  She continues to follow in the Coumadin clinic. No reported bleeding problems.  ECG from January 2015 showed atrial fibrillation with poor anterior R wave progression. I discussed this with her today.   Allergies  Allergen Reactions  . Codeine     REACTION: nausea and emesis    Current Outpatient Prescriptions  Medication Sig Dispense Refill  . acetaminophen (TYLENOL) 500 MG tablet Take 1,000 mg by mouth 3 (three) times daily as needed. For pain/fever      . beta carotene w/minerals (OCUVITE) tablet Take 1 tablet by mouth daily.        Marland Kitchen ketorolac (ACULAR) 0.5 % ophthalmic solution Place 1 drop into the right eye as directed.      Marland Kitchen levothyroxine (SYNTHROID, LEVOTHROID) 50 MCG tablet Take 50 mcg by mouth daily.      Marland Kitchen LORazepam (ATIVAN) 0.5 MG tablet Take 0.25 mg by mouth 3 (three) times daily.      . metoprolol succinate (TOPROL-XL) 25 MG 24 hr tablet Take 25 mg by mouth daily. Along with 50 mg      . metoprolol succinate (TOPROL-XL) 50 MG 24 hr tablet Take 50 mg by mouth daily. Take with or immediately following a meal, along with 25 mg      . mirtazapine (REMERON) 15 MG tablet Take 15 mg by mouth at bedtime.        Marland Kitchen OLANZapine (ZYPREXA) 2.5 MG tablet Take 2.5 mg by mouth at bedtime.      . pantoprazole (PROTONIX) 40 MG tablet Take 40 mg by mouth daily.      Marland Kitchen warfarin (COUMADIN) 4 MG tablet Take 1 tablet (4 mg total) by mouth daily. Take 4mg  on Sundays, Tuesdays, Thursdays and Saturdays  30 tablet  3   No current facility-administered medications for this  visit.    Past Medical History  Diagnosis Date  . Chronic atrial fibrillation   . Hyperlipemia   . Coronary atherosclerosis of native coronary artery     Minimal at cardiac catheterization in 04/1998  . Uterine carcinoma     TAH/BSO in 07/2000  . Glaucoma   . Depression with anxiety   . Hypothyroidism   . GERD (gastroesophageal reflux disease)     Esophageal dilatation for stricture 2013    Social History Katherine Mckinney reports that she has never smoked. She does not have any smokeless tobacco history on file. Katherine Mckinney reports that she does not drink alcohol.  Review of Systems Limited vision, is not able to read or drive at this time. Reports stable appetite. No orthopnea or PND.  Physical Examination Filed Vitals:   09/14/13 1001  BP: 145/85  Pulse: 70   Filed Weights   09/14/13 1001  Weight: 124 lb (56.246 kg)   Elderly woman, no distress. HEENT: Conjunctiva and lids normal, oropharynx clear. Neck: Supple, no elevated JVP or carotid bruits, no thyromegaly. Lungs: Clear to auscultation, nonlabored breathing at rest. Cardiac: Regular rate and rhythm, no S3, soft systolic murmur, no pericardial rub. Abdomen: Soft, nontender, bowel sounds present. Extremities: No pitting edema, distal pulses  2+. Skin: Warm and dry. Musculoskeletal: No kyphosis. Neuropsychiatric: Alert and oriented x3, affect grossly appropriate.   Problem List and Plan   Permanent atrial fibrillation Long-standing diagnosis. Clinically stable. Continue current regimen including Toprol-XL and Coumadin.  Coronary atherosclerosis of native coronary artery No angina symptoms. History of minimal coronary atherosclerosis at cardiac catheterization years ago.    Katherine Mckinney, M.D., F.A.C.C.

## 2013-09-14 NOTE — Assessment & Plan Note (Signed)
Long-standing diagnosis. Clinically stable. Continue current regimen including Toprol-XL and Coumadin.

## 2013-09-14 NOTE — Assessment & Plan Note (Signed)
No angina symptoms. History of minimal coronary atherosclerosis at cardiac catheterization years ago.

## 2013-09-14 NOTE — Patient Instructions (Signed)
Continue all current medications. Your physician wants you to follow up in:  1 year.  You will receive a reminder letter in the mail one-two months in advance.  If you don't receive a letter, please call our office to schedule the follow up appointment   

## 2013-10-05 ENCOUNTER — Ambulatory Visit (INDEPENDENT_AMBULATORY_CARE_PROVIDER_SITE_OTHER): Payer: Medicare Other | Admitting: *Deleted

## 2013-10-05 DIAGNOSIS — Z8679 Personal history of other diseases of the circulatory system: Secondary | ICD-10-CM

## 2013-10-05 DIAGNOSIS — I4891 Unspecified atrial fibrillation: Secondary | ICD-10-CM

## 2013-10-05 DIAGNOSIS — Z5181 Encounter for therapeutic drug level monitoring: Secondary | ICD-10-CM

## 2013-10-05 DIAGNOSIS — Z7901 Long term (current) use of anticoagulants: Secondary | ICD-10-CM

## 2013-10-05 DIAGNOSIS — I4821 Permanent atrial fibrillation: Secondary | ICD-10-CM

## 2013-10-05 LAB — POCT INR: INR: 1.7

## 2013-10-26 ENCOUNTER — Ambulatory Visit (INDEPENDENT_AMBULATORY_CARE_PROVIDER_SITE_OTHER): Payer: Medicare Other | Admitting: *Deleted

## 2013-10-26 DIAGNOSIS — I4821 Permanent atrial fibrillation: Secondary | ICD-10-CM

## 2013-10-26 DIAGNOSIS — Z8679 Personal history of other diseases of the circulatory system: Secondary | ICD-10-CM

## 2013-10-26 DIAGNOSIS — Z7901 Long term (current) use of anticoagulants: Secondary | ICD-10-CM

## 2013-10-26 DIAGNOSIS — I4891 Unspecified atrial fibrillation: Secondary | ICD-10-CM

## 2013-10-26 DIAGNOSIS — Z5181 Encounter for therapeutic drug level monitoring: Secondary | ICD-10-CM

## 2013-10-26 LAB — POCT INR: INR: 2.3

## 2013-11-23 ENCOUNTER — Ambulatory Visit (INDEPENDENT_AMBULATORY_CARE_PROVIDER_SITE_OTHER): Payer: Medicare Other | Admitting: *Deleted

## 2013-11-23 DIAGNOSIS — Z5181 Encounter for therapeutic drug level monitoring: Secondary | ICD-10-CM

## 2013-11-23 DIAGNOSIS — I4821 Permanent atrial fibrillation: Secondary | ICD-10-CM

## 2013-11-23 DIAGNOSIS — I4891 Unspecified atrial fibrillation: Secondary | ICD-10-CM

## 2013-11-23 DIAGNOSIS — Z7901 Long term (current) use of anticoagulants: Secondary | ICD-10-CM

## 2013-11-23 DIAGNOSIS — Z8679 Personal history of other diseases of the circulatory system: Secondary | ICD-10-CM

## 2013-11-23 LAB — POCT INR: INR: 2

## 2013-12-21 ENCOUNTER — Ambulatory Visit (INDEPENDENT_AMBULATORY_CARE_PROVIDER_SITE_OTHER): Payer: Medicare Other | Admitting: *Deleted

## 2013-12-21 DIAGNOSIS — Z5181 Encounter for therapeutic drug level monitoring: Secondary | ICD-10-CM

## 2013-12-21 DIAGNOSIS — Z8679 Personal history of other diseases of the circulatory system: Secondary | ICD-10-CM

## 2013-12-21 DIAGNOSIS — I4891 Unspecified atrial fibrillation: Secondary | ICD-10-CM

## 2013-12-21 DIAGNOSIS — Z7901 Long term (current) use of anticoagulants: Secondary | ICD-10-CM

## 2013-12-21 DIAGNOSIS — I4821 Permanent atrial fibrillation: Secondary | ICD-10-CM

## 2013-12-21 LAB — POCT INR: INR: 2.5

## 2014-01-18 ENCOUNTER — Ambulatory Visit (INDEPENDENT_AMBULATORY_CARE_PROVIDER_SITE_OTHER): Payer: Medicare Other | Admitting: *Deleted

## 2014-01-18 DIAGNOSIS — Z7901 Long term (current) use of anticoagulants: Secondary | ICD-10-CM

## 2014-01-18 DIAGNOSIS — Z5181 Encounter for therapeutic drug level monitoring: Secondary | ICD-10-CM

## 2014-01-18 DIAGNOSIS — Z8679 Personal history of other diseases of the circulatory system: Secondary | ICD-10-CM

## 2014-01-18 DIAGNOSIS — I4821 Permanent atrial fibrillation: Secondary | ICD-10-CM

## 2014-01-18 DIAGNOSIS — I4891 Unspecified atrial fibrillation: Secondary | ICD-10-CM

## 2014-01-18 LAB — POCT INR: INR: 3.6

## 2014-01-27 ENCOUNTER — Other Ambulatory Visit (HOSPITAL_COMMUNITY): Payer: Self-pay | Admitting: Internal Medicine

## 2014-01-27 DIAGNOSIS — R627 Adult failure to thrive: Secondary | ICD-10-CM

## 2014-01-27 DIAGNOSIS — R634 Abnormal weight loss: Secondary | ICD-10-CM

## 2014-02-01 ENCOUNTER — Ambulatory Visit (INDEPENDENT_AMBULATORY_CARE_PROVIDER_SITE_OTHER): Payer: Medicare Other | Admitting: *Deleted

## 2014-02-01 DIAGNOSIS — Z8679 Personal history of other diseases of the circulatory system: Secondary | ICD-10-CM

## 2014-02-01 DIAGNOSIS — I4891 Unspecified atrial fibrillation: Secondary | ICD-10-CM

## 2014-02-01 DIAGNOSIS — Z5181 Encounter for therapeutic drug level monitoring: Secondary | ICD-10-CM

## 2014-02-01 DIAGNOSIS — I4821 Permanent atrial fibrillation: Secondary | ICD-10-CM

## 2014-02-01 DIAGNOSIS — Z7901 Long term (current) use of anticoagulants: Secondary | ICD-10-CM

## 2014-02-01 LAB — POCT INR: INR: 4.6

## 2014-02-04 ENCOUNTER — Ambulatory Visit (HOSPITAL_COMMUNITY)
Admission: RE | Admit: 2014-02-04 | Discharge: 2014-02-04 | Disposition: A | Discharge status: Home / Self Care | Payer: Medicare Other | Source: Ambulatory Visit | Attending: Internal Medicine | Admitting: Internal Medicine

## 2014-02-04 DIAGNOSIS — I319 Disease of pericardium, unspecified: Secondary | ICD-10-CM | POA: Diagnosis not present

## 2014-02-04 DIAGNOSIS — R627 Adult failure to thrive: Secondary | ICD-10-CM | POA: Insufficient documentation

## 2014-02-04 DIAGNOSIS — R634 Abnormal weight loss: Secondary | ICD-10-CM | POA: Insufficient documentation

## 2014-02-04 DIAGNOSIS — D3500 Benign neoplasm of unspecified adrenal gland: Secondary | ICD-10-CM | POA: Insufficient documentation

## 2014-02-04 MED ORDER — IOHEXOL 300 MG/ML  SOLN
100.0000 mL | Freq: Once | INTRAMUSCULAR | Status: AC | PRN
  Administered 2014-02-04: 100 mL via INTRAVENOUS

## 2014-02-07 ENCOUNTER — Telehealth: Payer: Self-pay | Admitting: *Deleted

## 2014-02-07 NOTE — Telephone Encounter (Signed)
Error

## 2014-02-09 ENCOUNTER — Ambulatory Visit (INDEPENDENT_AMBULATORY_CARE_PROVIDER_SITE_OTHER): Payer: Medicare Other | Admitting: Cardiology

## 2014-02-09 ENCOUNTER — Other Ambulatory Visit (INDEPENDENT_AMBULATORY_CARE_PROVIDER_SITE_OTHER): Payer: Medicare Other

## 2014-02-09 ENCOUNTER — Encounter: Payer: Self-pay | Admitting: Cardiology

## 2014-02-09 VITALS — BP 117/77 | HR 76 | Ht 64.0 in | Wt 118.0 lb

## 2014-02-09 DIAGNOSIS — I3139 Other pericardial effusion (noninflammatory): Secondary | ICD-10-CM

## 2014-02-09 DIAGNOSIS — I319 Disease of pericardium, unspecified: Secondary | ICD-10-CM

## 2014-02-09 DIAGNOSIS — I4821 Permanent atrial fibrillation: Secondary | ICD-10-CM

## 2014-02-09 DIAGNOSIS — I2510 Atherosclerotic heart disease of native coronary artery without angina pectoris: Secondary | ICD-10-CM

## 2014-02-09 DIAGNOSIS — I313 Pericardial effusion (noninflammatory): Secondary | ICD-10-CM

## 2014-02-09 DIAGNOSIS — I4891 Unspecified atrial fibrillation: Secondary | ICD-10-CM

## 2014-02-09 NOTE — Progress Notes (Signed)
Clinical Summary Katherine Mckinney is an 80 y.o.female last seen in April. She is referred back to the office by Dr. Willey Blade. She had a recent CT scan of the abdomen and pelvis due to failure to thrive and weight loss. No acute intra-abdominal findings were noted, incidental mention was made of a moderate sized pericardial effusion.  She lives in [place]. She has several different complaints, her meds very anxious about her overall status. States she has a lot of difficulty seeing and has not been able to read. Appetite is very limited. She reports chronic shortness of breath without change, no chest pain. Feels "uneasy" inside. She has had no syncope.  I discussed the results of her CT scan. It is not entirely clear that the pericardial effusion is symptomatic. She has not had a recent echocardiogram.   Allergies  Allergen Reactions  . Codeine     REACTION: nausea and emesis    Current Outpatient Prescriptions  Medication Sig Dispense Refill  . acetaminophen (TYLENOL) 500 MG tablet Take 1,000 mg by mouth 3 (three) times daily as needed. For pain/fever      . beta carotene w/minerals (OCUVITE) tablet Take 1 tablet by mouth daily.        Marland Kitchen ketorolac (ACULAR) 0.5 % ophthalmic solution Place 1 drop into both eyes as directed.       Marland Kitchen levothyroxine (SYNTHROID, LEVOTHROID) 50 MCG tablet Take 50 mcg by mouth daily.      Marland Kitchen LORazepam (ATIVAN) 0.5 MG tablet Take 0.25 mg by mouth 3 (three) times daily.      . metoprolol succinate (TOPROL-XL) 25 MG 24 hr tablet Take 25 mg by mouth daily. Takes with the 50mg  for a total of 75mg       . metoprolol succinate (TOPROL-XL) 50 MG 24 hr tablet Take 50 mg by mouth daily. Takes with the 25mg  for a total of 75mg       . OLANZapine (ZYPREXA) 5 MG tablet Take 5 mg by mouth at bedtime.      . pantoprazole (PROTONIX) 40 MG tablet Take 40 mg by mouth daily.      Marland Kitchen warfarin (COUMADIN) 4 MG tablet Take 1 tablet (4 mg total) by mouth daily. Take 4mg  on Sundays, Tuesdays,  Thursdays and Saturdays  30 tablet  3   No current facility-administered medications for this visit.    Past Medical History  Diagnosis Date  . Chronic atrial fibrillation   . Hyperlipemia   . Coronary atherosclerosis of native coronary artery     Minimal at cardiac catheterization in 04/1998  . Uterine carcinoma     TAH/BSO in 07/2000  . Glaucoma   . Depression with anxiety   . Hypothyroidism   . GERD (gastroesophageal reflux disease)     Esophageal dilatation for stricture 2013    Social History Ms. Troop reports that she has never smoked. She does not have any smokeless tobacco history on file. Ms. Grigg reports that she does not drink alcohol.  Review of Systems Other systems reviewed and negative except as outlined.  Physical Examination Filed Vitals:   02/09/14 1426  BP: 117/77  Pulse: 76   Filed Weights   02/09/14 1426  Weight: 118 lb (53.524 kg)    Elderly woman, no distress.  HEENT: Conjunctiva and lids normal, oropharynx clear.  Neck: Supple, no elevated JVP or carotid bruits, no thyromegaly.  Lungs: Clear to auscultation, nonlabored breathing at rest.  Cardiac: Regular rate and rhythm, no S3, soft systolic  murmur, no pericardial rub.  Abdomen: Soft, nontender, bowel sounds present.  Extremities: No pitting edema, distal pulses 2+.  Skin: Warm and dry.  Musculoskeletal: No kyphosis.  Neuropsychiatric: Alert and oriented x3, affect grossly appropriate.   Problem List and Plan   Pericardial effusion Incidentally noted by recent CT scan of the abdomen and pelvis. Described as moderate in size. She does not endorse any chest pain. Has had no syncope. Chronic history of atrial fibrillation on Coumadin. Plan is to proceed with a limited echocardiogram to further assess pericardial size and any potential hemodynamic significance. Blood pressure and heart rate are stable however.  Permanent atrial fibrillation Heart rate controlled today. She continues on  beta blocker and Coumadin.    Satira Sark, M.D., F.A.C.C.

## 2014-02-09 NOTE — Patient Instructions (Signed)
Your physician recommends that you schedule a follow-up appointment in: 6 months. You will receive a reminder letter in the mail in about 4 months reminding you to call and schedule your appointment. If you don't receive this letter, please contact our office. Your physician recommends that you continue on your current medications as directed. Please refer to the Current Medication list given to you today. Your physician has requested that you have a limited echocardiogram. Echocardiography is a painless test that uses sound waves to create images of your heart. It provides your doctor with information about the size and shape of your heart and how well your heart's chambers and valves are working. This procedure takes approximately one hour. There are no restrictions for this procedure.

## 2014-02-09 NOTE — Assessment & Plan Note (Signed)
Incidentally noted by recent CT scan of the abdomen and pelvis. Described as moderate in size. She does not endorse any chest pain. Has had no syncope. Chronic history of atrial fibrillation on Coumadin. Plan is to proceed with a limited echocardiogram to further assess pericardial size and any potential hemodynamic significance. Blood pressure and heart rate are stable however.

## 2014-02-09 NOTE — Assessment & Plan Note (Signed)
Heart rate controlled today. She continues on beta blocker and Coumadin.

## 2014-02-11 ENCOUNTER — Telehealth: Payer: Self-pay | Admitting: *Deleted

## 2014-02-11 DIAGNOSIS — I3139 Other pericardial effusion (noninflammatory): Secondary | ICD-10-CM

## 2014-02-11 DIAGNOSIS — I313 Pericardial effusion (noninflammatory): Secondary | ICD-10-CM

## 2014-02-11 NOTE — Telephone Encounter (Signed)
Message copied by Merlene Laughter on Fri Feb 11, 2014  4:10 PM ------      Message from: Barbarann Ehlers A      Created: Thu Feb 10, 2014  8:40 AM       Forward to [place]       ----- Message -----         From: Satira Sark, MD         Sent: 02/10/2014   7:50 AM           To: Asencion Noble, MD, Bernita Raisin, RN            Reviewed. Pericardial effusion is moderate as noted above without evidence of hemodynamic significance. Would recommend observation at this point. Recheck limited echocardiogram to assess pericardial effusion size in 3 months with office visit.       ------

## 2014-02-14 NOTE — Telephone Encounter (Signed)
Patient informed. 

## 2014-02-15 ENCOUNTER — Ambulatory Visit (INDEPENDENT_AMBULATORY_CARE_PROVIDER_SITE_OTHER): Payer: Medicare Other | Admitting: *Deleted

## 2014-02-15 DIAGNOSIS — Z5181 Encounter for therapeutic drug level monitoring: Secondary | ICD-10-CM

## 2014-02-15 DIAGNOSIS — I4891 Unspecified atrial fibrillation: Secondary | ICD-10-CM

## 2014-02-15 DIAGNOSIS — I4821 Permanent atrial fibrillation: Secondary | ICD-10-CM

## 2014-02-15 DIAGNOSIS — Z8679 Personal history of other diseases of the circulatory system: Secondary | ICD-10-CM

## 2014-02-15 DIAGNOSIS — Z7901 Long term (current) use of anticoagulants: Secondary | ICD-10-CM

## 2014-02-15 LAB — POCT INR: INR: 2.4

## 2014-03-08 ENCOUNTER — Ambulatory Visit (INDEPENDENT_AMBULATORY_CARE_PROVIDER_SITE_OTHER): Payer: Medicare Other | Admitting: *Deleted

## 2014-03-08 DIAGNOSIS — I482 Chronic atrial fibrillation: Secondary | ICD-10-CM

## 2014-03-08 DIAGNOSIS — I4891 Unspecified atrial fibrillation: Secondary | ICD-10-CM

## 2014-03-08 DIAGNOSIS — Z7901 Long term (current) use of anticoagulants: Secondary | ICD-10-CM

## 2014-03-08 DIAGNOSIS — Z8679 Personal history of other diseases of the circulatory system: Secondary | ICD-10-CM

## 2014-03-08 DIAGNOSIS — Z5181 Encounter for therapeutic drug level monitoring: Secondary | ICD-10-CM

## 2014-03-08 DIAGNOSIS — I4821 Permanent atrial fibrillation: Secondary | ICD-10-CM

## 2014-03-08 LAB — POCT INR: INR: 3

## 2014-04-05 ENCOUNTER — Ambulatory Visit (INDEPENDENT_AMBULATORY_CARE_PROVIDER_SITE_OTHER): Payer: Medicare Other | Admitting: *Deleted

## 2014-04-05 DIAGNOSIS — Z5181 Encounter for therapeutic drug level monitoring: Secondary | ICD-10-CM

## 2014-04-05 DIAGNOSIS — I4891 Unspecified atrial fibrillation: Secondary | ICD-10-CM

## 2014-04-05 DIAGNOSIS — I482 Chronic atrial fibrillation: Secondary | ICD-10-CM

## 2014-04-05 DIAGNOSIS — Z7901 Long term (current) use of anticoagulants: Secondary | ICD-10-CM

## 2014-04-05 DIAGNOSIS — I4821 Permanent atrial fibrillation: Secondary | ICD-10-CM

## 2014-04-05 DIAGNOSIS — Z8679 Personal history of other diseases of the circulatory system: Secondary | ICD-10-CM

## 2014-04-05 LAB — POCT INR: INR: 2.4

## 2014-04-12 ENCOUNTER — Encounter: Payer: Self-pay | Admitting: Internal Medicine

## 2014-04-20 ENCOUNTER — Ambulatory Visit (INDEPENDENT_AMBULATORY_CARE_PROVIDER_SITE_OTHER): Payer: Medicare Other | Admitting: Gastroenterology

## 2014-04-20 ENCOUNTER — Encounter: Payer: Self-pay | Admitting: Gastroenterology

## 2014-04-20 VITALS — BP 141/89 | HR 53 | Temp 98.4°F | Ht 64.0 in | Wt 114.4 lb

## 2014-04-20 DIAGNOSIS — R7989 Other specified abnormal findings of blood chemistry: Secondary | ICD-10-CM

## 2014-04-20 DIAGNOSIS — I2510 Atherosclerotic heart disease of native coronary artery without angina pectoris: Secondary | ICD-10-CM

## 2014-04-20 DIAGNOSIS — R945 Abnormal results of liver function studies: Principal | ICD-10-CM

## 2014-04-20 NOTE — Progress Notes (Signed)
Referring Provider: Asencion Noble, MD Primary Care Physician:  Asencion Noble, MD  Primary GI: Dr. Gala Romney   Chief Complaint  Patient presents with  . OTHER    elevated lfts    HPI:   Katherine Mckinney presents today as an urgent work-in at the request of Dr. Willey Blade secondary to elevated LFTs. Sept 2015 Alk Phos 132, ALT 58. Recheck in Nov 2015 with Alk Phos 146, AST 46, ALT further increased to 83. No abdominal pain, no N/V. Protonix for GERD. No hematochezia or melena. Lives in [place]. Not the best appetite. Notes about 10 lbs weight loss over the past 2-3 months. No jaundice. On depression and anxiety medication. On Lamictal "not all that long". Unclear when she started Zyprexa.   Biggest issue is depression/anxiety. Takes Miralax a few times a week "just to be sure".   Past Medical History  Diagnosis Date  . Chronic atrial fibrillation   . Hyperlipemia   . Coronary atherosclerosis of native coronary artery     Minimal at cardiac catheterization in 04/1998  . Uterine carcinoma     TAH/BSO in 07/2000  . Glaucoma   . Depression with anxiety   . Hypothyroidism   . GERD (gastroesophageal reflux disease)     Esophageal dilatation for stricture 2013    Past Surgical History  Procedure Laterality Date  . Tonsillectomy    . Appendectomy    . Breast excisional biopsy  1997    Right  . Total abdominal hysterectomy w/ bilateral salpingoophorectomy  07/2000    Neoplastic disease  . Colonoscopy  01/23/07    Diminutive rectal polyp/scattered sigmoid diverticula/tubular adenoma. Due 01/2012  . Esophagogastroduodenoscopy  08/14/2011    Dr. Gala Romney: mild erosive reflux esophagitis, probable subtle ring at GE junction s/p Maloney dilation. small hiatal hernia  . Maloney dilation  08/14/2011    Procedure: Venia Minks DILATION;  Surgeon: Daneil Dolin, MD;  Location: [facility];  Service: Endoscopy;  Laterality: N/A;    Current Outpatient Prescriptions  Medication Sig Dispense Refill  .  acetaminophen (TYLENOL) 500 MG tablet Take 1,000 mg by mouth 3 (three) times daily as needed. For pain/fever    . beta carotene w/minerals (OCUVITE) tablet Take 1 tablet by mouth daily.      Marland Kitchen ketorolac (ACULAR) 0.5 % ophthalmic solution Place 1 drop into both eyes as directed.     . lamoTRIgine (LAMICTAL) 25 MG tablet Take 25 mg by mouth 2 (two) times daily.    Marland Kitchen levothyroxine (SYNTHROID, LEVOTHROID) 50 MCG tablet Take 50 mcg by mouth daily.    Marland Kitchen LORazepam (ATIVAN) 0.5 MG tablet Take 0.25 mg by mouth 2 (two) times daily.     . metoprolol succinate (TOPROL-XL) 25 MG 24 hr tablet Take 25 mg by mouth daily. Takes with the $Remove'50mg'yWpTfmt$  for a total of $Remove'75mg'gcSPaqi$     . metoprolol succinate (TOPROL-XL) 50 MG 24 hr tablet Take 50 mg by mouth daily. Takes with the $Remove'25mg'iSZLbSv$  for a total of $Remove'75mg'TejuFXq$     . OLANZapine (ZYPREXA) 5 MG tablet Take 5 mg by mouth at bedtime. Takes 2.5 mg at bedtime    . pantoprazole (PROTONIX) 40 MG tablet Take 40 mg by mouth daily.    Marland Kitchen warfarin (COUMADIN) 4 MG tablet Take 1 tablet (4 mg total) by mouth daily. Take $RemoveBef'4mg'GtnWFeUMus$  on Sundays, Tuesdays, Thursdays and Saturdays 30 tablet 3   No current facility-administered medications for this visit.    Allergies as of 04/20/2014 - Review  Complete 04/20/2014  Allergen Reaction Noted  . Codeine      Family History  Problem Relation Age of Onset  . Heart attack Mother     H/o CAD in 57 brothers as well  . Lung cancer Father     Pneumoconiosis  . Coronary artery disease Brother     +3 other brothers, 2 with acute MI  . Colon cancer Neg Hx     History   Social History  . Marital Status: Widowed    Spouse Name: N/A    Number of Children: 2  . Years of Education: N/A   Occupational History  . Retired    Social History Main Topics  . Smoking status: Never Smoker   . Smokeless tobacco: None     Comment: Never smoked  . Alcohol Use: No  . Drug Use: No  . Sexual Activity: None   Other Topics Concern  . None   Social History Narrative    Widowed.   Daughter, [name], Nurse Practitioner. Cell [phone]. Home [phone]. Work [phone] X [employer].   Son, [name], pharmacist. [phone].   Exercises regularly    Review of Systems: As mentioned in HPI.   Physical Exam: BP 141/89 mmHg  Pulse 53  Temp(Src) 98.4 F (36.9 C) (Oral)  Ht $R'5\' 4"'aw$  (1.626 m)  Wt 114 lb 6.4 oz (51.891 kg)  BMI 19.63 kg/m2 General:   Alert and oriented. Anxious.  Head:  Normocephalic and atraumatic. Eyes:  Conjuctiva clear without scleral icterus. Mouth:  Oral mucosa pink and moist.  Heart:  S1, S2 present without murmurs, rubs, or gallops. Regular rate and rhythm. Abdomen:  +BS, soft, non-tender and non-distended. No rebound or guarding. No HSM or masses noted. Msk:  Kyphosis, shuffling gait, frail-appearing.  Extremities:  Without edema. Neurologic:  Alert and  oriented x4;  grossly normal neurologically. Skin:  Intact without significant lesions or rashes. Psych:  Alert and cooperative. Normal mood and affect.

## 2014-04-20 NOTE — Patient Instructions (Signed)
Please have blood work completed when you are able.   I feel that your liver number elevation may be due to some of your medication. We will need to discuss this with the psychiatrist that prescribed this.   I would also like an ultrasound done in the future. If you change your mind, let me know, otherwise we will proceed with this in January.

## 2014-04-25 ENCOUNTER — Telehealth: Payer: Self-pay | Admitting: Gastroenterology

## 2014-04-25 ENCOUNTER — Encounter: Payer: Self-pay | Admitting: Gastroenterology

## 2014-04-25 DIAGNOSIS — R945 Abnormal results of liver function studies: Principal | ICD-10-CM

## 2014-04-25 DIAGNOSIS — R7989 Other specified abnormal findings of blood chemistry: Secondary | ICD-10-CM | POA: Insufficient documentation

## 2014-04-25 NOTE — Assessment & Plan Note (Signed)
80 year old female with elevation in alk phos and transaminases, increased since Sept 2015. No prior history of liver disease; she does report decreased appetite and self-reported weight loss, which is concerning but likely multifactorial. Declining ultrasound of abdomen until January but agreeable to blood work now. Strongly suspect medication effect; she is on both Lamictal and Zyprexa, both of which could be contributing to elevated LFTs. To be thorough, will order more extensive blood work. Will also reach out to psychiatrist to see when she was started on these agents; would consider discontinuing with recheck of LFTs in 6-8 weeks. Further recommendations after blood work completed.

## 2014-04-25 NOTE — Telephone Encounter (Signed)
Can we find out from her psychiatrist office when she started taking Lamictal and Zyprexa? I am concerned this are increased her LFTs.

## 2014-04-26 NOTE — Progress Notes (Signed)
cc'ed to pcp °

## 2014-04-27 ENCOUNTER — Other Ambulatory Visit: Payer: Self-pay

## 2014-04-27 ENCOUNTER — Other Ambulatory Visit (INDEPENDENT_AMBULATORY_CARE_PROVIDER_SITE_OTHER): Payer: Medicare Other

## 2014-04-27 DIAGNOSIS — I3139 Other pericardial effusion (noninflammatory): Secondary | ICD-10-CM

## 2014-04-27 DIAGNOSIS — I313 Pericardial effusion (noninflammatory): Secondary | ICD-10-CM

## 2014-04-27 DIAGNOSIS — I482 Chronic atrial fibrillation: Secondary | ICD-10-CM

## 2014-04-27 NOTE — Telephone Encounter (Signed)
Tried to call pt- Katherine Mckinney to find out the name of her psychiatrist.

## 2014-04-29 ENCOUNTER — Ambulatory Visit (INDEPENDENT_AMBULATORY_CARE_PROVIDER_SITE_OTHER): Payer: Medicare Other | Admitting: Cardiology

## 2014-04-29 ENCOUNTER — Encounter: Payer: Self-pay | Admitting: Cardiology

## 2014-04-29 VITALS — BP 145/92 | HR 67 | Ht 64.0 in | Wt 116.0 lb

## 2014-04-29 DIAGNOSIS — I3139 Other pericardial effusion (noninflammatory): Secondary | ICD-10-CM

## 2014-04-29 DIAGNOSIS — I4821 Permanent atrial fibrillation: Secondary | ICD-10-CM

## 2014-04-29 DIAGNOSIS — I319 Disease of pericardium, unspecified: Secondary | ICD-10-CM

## 2014-04-29 DIAGNOSIS — I482 Chronic atrial fibrillation: Secondary | ICD-10-CM

## 2014-04-29 DIAGNOSIS — I2510 Atherosclerotic heart disease of native coronary artery without angina pectoris: Secondary | ICD-10-CM

## 2014-04-29 DIAGNOSIS — I313 Pericardial effusion (noninflammatory): Secondary | ICD-10-CM

## 2014-04-29 NOTE — Progress Notes (Signed)
Reason for visit: Atrial fibrillation, pericardial effusion  Clinical Summary Ms. Latino is an 80 y.o.female last seen in September. She presents for a routine visit, still resides in [place]. She reports no palpitations or chest pain, indicates stable appetite, no nausea or emesis. Weight has been relatively stable. She uses a walker, denies any falls or bleeding problems on Coumadin.  Recent follow-up limited echocardiogram showed mild LVH with LVEF 60-65% with sclerotic aortic valve and MAC, severe biatrial enlargement, and stable moderate sized pericardial effusion without clear evidence of tamponade. We discussed this today.  She is undergoing workup by GI secondary to abnormal LFTs with elevation in alkaline phosphatase and transaminases. Medications or suspected although she does have further testing pending.  Allergies  Allergen Reactions  . Codeine     REACTION: nausea and emesis    Current Outpatient Prescriptions  Medication Sig Dispense Refill  . acetaminophen (TYLENOL) 500 MG tablet Take 1,000 mg by mouth 3 (three) times daily as needed. For pain/fever    . beta carotene w/minerals (OCUVITE) tablet Take 1 tablet by mouth daily.      Marland Kitchen ketorolac (ACULAR) 0.5 % ophthalmic solution Place 1 drop into both eyes as directed.     . lamoTRIgine (LAMICTAL) 25 MG tablet Take 25 mg by mouth 2 (two) times daily.    Marland Kitchen levothyroxine (SYNTHROID, LEVOTHROID) 50 MCG tablet Take 50 mcg by mouth daily.    Marland Kitchen LORazepam (ATIVAN) 0.5 MG tablet Take 0.25 mg by mouth 2 (two) times daily.     . metoprolol succinate (TOPROL-XL) 25 MG 24 hr tablet Take 25 mg by mouth daily. Takes with the 50mg  for a total of 75mg     . metoprolol succinate (TOPROL-XL) 50 MG 24 hr tablet Take 50 mg by mouth daily. Takes with the 25mg  for a total of 75mg     . OLANZapine (ZYPREXA) 5 MG tablet Take 5 mg by mouth at bedtime. Takes 2.5 mg at bedtime    . pantoprazole (PROTONIX) 40 MG tablet Take 40 mg by mouth daily.      Marland Kitchen warfarin (COUMADIN) 4 MG tablet Take 1 tablet (4 mg total) by mouth daily. Take 4mg  on Sundays, Tuesdays, Thursdays and Saturdays 30 tablet 3   No current facility-administered medications for this visit.    Past Medical History  Diagnosis Date  . Chronic atrial fibrillation   . Hyperlipemia   . Coronary atherosclerosis of native coronary artery     Minimal at cardiac catheterization in 04/1998  . Uterine carcinoma     TAH/BSO in 07/2000  . Glaucoma   . Depression with anxiety   . Hypothyroidism   . GERD (gastroesophageal reflux disease)     Esophageal dilatation for stricture 2013    Social History Ms. Eberwein reports that she has never smoked. She has never used smokeless tobacco. Ms. Ciaramitaro reports that she does not drink alcohol.  Review of Systems Complete review of systems negative except as otherwise outlined in the clinical summary.  Physical Examination Filed Vitals:   04/29/14 1136  BP: 145/92  Pulse: 67   Filed Weights   04/29/14 1136  Weight: 116 lb (52.617 kg)    Elderly woman, no distress.  HEENT: Conjunctiva and lids normal, oropharynx clear.  Neck: Supple, no elevated JVP or carotid bruits, no thyromegaly.  Lungs: Clear to auscultation, nonlabored breathing at rest.  Cardiac: Regular rate and rhythm, no S3, soft systolic murmur, no pericardial rub.  Abdomen: Soft, nontender, bowel sounds present.  Extremities: No pitting edema, distal pulses 2+.  Skin: Warm and dry.  Musculoskeletal: No kyphosis.    Problem List and Plan   Pericardial effusion Moderate, circumferential without evidence of tamponade and seems to be asymptomatic. Stable by follow-up echocardiogram.  Permanent atrial fibrillation Heart rate-controlled on Toprol-XL. Continue Coumadin for stroke prophylaxis.    Satira Sark, M.D., F.A.C.C.

## 2014-04-29 NOTE — Patient Instructions (Signed)

## 2014-04-29 NOTE — Assessment & Plan Note (Signed)
Heart rate-controlled on Toprol-XL. Continue Coumadin for stroke prophylaxis.

## 2014-04-29 NOTE — Assessment & Plan Note (Signed)
Moderate, circumferential without evidence of tamponade and seems to be asymptomatic. Stable by follow-up echocardiogram.

## 2014-05-02 NOTE — Telephone Encounter (Signed)
Tried to call pt again, LMOM. Mailed letter.

## 2014-05-03 ENCOUNTER — Ambulatory Visit (INDEPENDENT_AMBULATORY_CARE_PROVIDER_SITE_OTHER): Payer: Medicare Other | Admitting: *Deleted

## 2014-05-03 DIAGNOSIS — Z8679 Personal history of other diseases of the circulatory system: Secondary | ICD-10-CM | POA: Diagnosis not present

## 2014-05-03 DIAGNOSIS — I4821 Permanent atrial fibrillation: Secondary | ICD-10-CM

## 2014-05-03 DIAGNOSIS — I4891 Unspecified atrial fibrillation: Secondary | ICD-10-CM

## 2014-05-03 DIAGNOSIS — I482 Chronic atrial fibrillation: Secondary | ICD-10-CM

## 2014-05-03 DIAGNOSIS — Z7901 Long term (current) use of anticoagulants: Secondary | ICD-10-CM

## 2014-05-03 DIAGNOSIS — Z5181 Encounter for therapeutic drug level monitoring: Secondary | ICD-10-CM

## 2014-05-03 LAB — POCT INR: INR: 2

## 2014-05-05 LAB — MITOCHONDRIAL ANTIBODIES: Mitochondrial M2 Ab, IgG: 0.36 (ref <0.91)

## 2014-05-05 LAB — HEPATIC FUNCTION PANEL
ALT: 12 U/L (ref 0–35)
AST: 21 U/L (ref 0–37)
Albumin: 4.1 g/dL (ref 3.5–5.2)
Alkaline Phosphatase: 68 U/L (ref 39–117)
BILIRUBIN DIRECT: 0.1 mg/dL (ref 0.0–0.3)
Indirect Bilirubin: 0.5 mg/dL (ref 0.2–1.2)
Total Bilirubin: 0.6 mg/dL (ref 0.2–1.2)
Total Protein: 6.7 g/dL (ref 6.0–8.3)

## 2014-05-05 LAB — ANTI-SMOOTH MUSCLE ANTIBODY, IGG: SMOOTH MUSCLE AB: 21 U — AB (ref <20)

## 2014-05-05 LAB — ANA: Anti Nuclear Antibody(ANA): NEGATIVE

## 2014-05-05 LAB — HEPATITIS C ANTIBODY: HCV Ab: NEGATIVE

## 2014-05-06 NOTE — Telephone Encounter (Signed)
pts son- Aldine Chakraborty, called- pt sees Dr.Carey Marcelino Freestone at [facility], [place], [place] [address]. Phone number is [phone]. I am going to send them a letter about the pts meds.

## 2014-05-06 NOTE — Telephone Encounter (Signed)
Per AS- need to wait on this and she will give more recommendations.

## 2014-05-06 NOTE — Telephone Encounter (Signed)
The fax number is [phone]

## 2014-05-10 NOTE — Progress Notes (Signed)
Quick Note:  LFTs normal. ASMA just weakly positive. All other labs normal as expected. Highly recommend US abdomen. ______

## 2014-05-16 ENCOUNTER — Other Ambulatory Visit: Payer: Self-pay

## 2014-05-16 DIAGNOSIS — R109 Unspecified abdominal pain: Secondary | ICD-10-CM

## 2014-05-16 DIAGNOSIS — R7989 Other specified abnormal findings of blood chemistry: Secondary | ICD-10-CM

## 2014-05-16 DIAGNOSIS — R945 Abnormal results of liver function studies: Secondary | ICD-10-CM

## 2014-05-19 ENCOUNTER — Ambulatory Visit (HOSPITAL_COMMUNITY)
Admission: RE | Admit: 2014-05-19 | Discharge: 2014-05-19 | Disposition: A | Discharge status: Home / Self Care | Payer: Medicare Other | Source: Ambulatory Visit | Attending: Gastroenterology | Admitting: Gastroenterology

## 2014-05-19 DIAGNOSIS — I4891 Unspecified atrial fibrillation: Secondary | ICD-10-CM | POA: Insufficient documentation

## 2014-05-19 DIAGNOSIS — R1084 Generalized abdominal pain: Secondary | ICD-10-CM | POA: Diagnosis not present

## 2014-05-19 DIAGNOSIS — I700 Atherosclerosis of aorta: Secondary | ICD-10-CM | POA: Diagnosis not present

## 2014-05-19 DIAGNOSIS — I2510 Atherosclerotic heart disease of native coronary artery without angina pectoris: Secondary | ICD-10-CM | POA: Diagnosis not present

## 2014-05-19 DIAGNOSIS — E785 Hyperlipidemia, unspecified: Secondary | ICD-10-CM | POA: Insufficient documentation

## 2014-05-19 DIAGNOSIS — R109 Unspecified abdominal pain: Secondary | ICD-10-CM

## 2014-05-19 DIAGNOSIS — K219 Gastro-esophageal reflux disease without esophagitis: Secondary | ICD-10-CM | POA: Insufficient documentation

## 2014-05-19 DIAGNOSIS — E8809 Other disorders of plasma-protein metabolism, not elsewhere classified: Secondary | ICD-10-CM | POA: Diagnosis not present

## 2014-05-19 DIAGNOSIS — K8020 Calculus of gallbladder without cholecystitis without obstruction: Secondary | ICD-10-CM | POA: Diagnosis not present

## 2014-05-19 DIAGNOSIS — N289 Disorder of kidney and ureter, unspecified: Secondary | ICD-10-CM | POA: Diagnosis not present

## 2014-05-31 ENCOUNTER — Ambulatory Visit (INDEPENDENT_AMBULATORY_CARE_PROVIDER_SITE_OTHER): Payer: Medicare Other | Admitting: *Deleted

## 2014-05-31 DIAGNOSIS — Z8679 Personal history of other diseases of the circulatory system: Secondary | ICD-10-CM

## 2014-05-31 DIAGNOSIS — I482 Chronic atrial fibrillation: Secondary | ICD-10-CM

## 2014-05-31 DIAGNOSIS — I4821 Permanent atrial fibrillation: Secondary | ICD-10-CM

## 2014-05-31 DIAGNOSIS — I4891 Unspecified atrial fibrillation: Secondary | ICD-10-CM

## 2014-05-31 DIAGNOSIS — Z5181 Encounter for therapeutic drug level monitoring: Secondary | ICD-10-CM

## 2014-05-31 DIAGNOSIS — Z7901 Long term (current) use of anticoagulants: Secondary | ICD-10-CM

## 2014-05-31 LAB — POCT INR: INR: 1.7

## 2014-05-31 NOTE — Progress Notes (Signed)
Quick Note:  LFTs now normalized.  US abdomen shows gallstones but no acute findings. She did not have any abdominal pain but did note vague symptoms of decreased appetite and weight loss, which is concerning. Let's have her repeat LFTs at the end of Jan and return for office visit early February. She did have a weak positive ASMA, but it was just marginally above normal. ______

## 2014-06-01 ENCOUNTER — Encounter: Payer: Self-pay | Admitting: Internal Medicine

## 2014-06-01 ENCOUNTER — Other Ambulatory Visit: Payer: Self-pay | Admitting: Gastroenterology

## 2014-06-01 ENCOUNTER — Other Ambulatory Visit: Payer: Self-pay

## 2014-06-01 DIAGNOSIS — R945 Abnormal results of liver function studies: Principal | ICD-10-CM

## 2014-06-01 DIAGNOSIS — R7989 Other specified abnormal findings of blood chemistry: Secondary | ICD-10-CM

## 2014-06-01 NOTE — Progress Notes (Signed)
APPOINTMENT MADE AND LETTER MAILED.  SPOKE TO PATIENT AND SHE STATED THAT SHE WAS SICK AND FOR ME TO MAIL THE APPOINTMENT LETTER TO HER.

## 2014-06-21 ENCOUNTER — Ambulatory Visit (INDEPENDENT_AMBULATORY_CARE_PROVIDER_SITE_OTHER): Payer: Medicare Other | Admitting: *Deleted

## 2014-06-21 DIAGNOSIS — Z5181 Encounter for therapeutic drug level monitoring: Secondary | ICD-10-CM

## 2014-06-21 DIAGNOSIS — I4821 Permanent atrial fibrillation: Secondary | ICD-10-CM

## 2014-06-21 DIAGNOSIS — I482 Chronic atrial fibrillation: Secondary | ICD-10-CM

## 2014-06-21 DIAGNOSIS — Z8679 Personal history of other diseases of the circulatory system: Secondary | ICD-10-CM

## 2014-06-21 DIAGNOSIS — I4891 Unspecified atrial fibrillation: Secondary | ICD-10-CM

## 2014-06-21 DIAGNOSIS — Z7901 Long term (current) use of anticoagulants: Secondary | ICD-10-CM

## 2014-06-21 LAB — POCT INR: INR: 2.2

## 2014-06-30 ENCOUNTER — Ambulatory Visit: Payer: Medicare Other | Admitting: Gastroenterology

## 2014-07-19 ENCOUNTER — Ambulatory Visit (INDEPENDENT_AMBULATORY_CARE_PROVIDER_SITE_OTHER): Payer: Medicare Other | Admitting: *Deleted

## 2014-07-19 DIAGNOSIS — Z8679 Personal history of other diseases of the circulatory system: Secondary | ICD-10-CM | POA: Diagnosis not present

## 2014-07-19 DIAGNOSIS — Z7901 Long term (current) use of anticoagulants: Secondary | ICD-10-CM | POA: Diagnosis not present

## 2014-07-19 DIAGNOSIS — I4891 Unspecified atrial fibrillation: Secondary | ICD-10-CM | POA: Diagnosis not present

## 2014-07-19 DIAGNOSIS — Z5181 Encounter for therapeutic drug level monitoring: Secondary | ICD-10-CM

## 2014-07-19 DIAGNOSIS — I4821 Permanent atrial fibrillation: Secondary | ICD-10-CM

## 2014-07-19 DIAGNOSIS — I482 Chronic atrial fibrillation: Secondary | ICD-10-CM

## 2014-07-19 LAB — POCT INR: INR: 2.4

## 2014-08-16 ENCOUNTER — Ambulatory Visit (INDEPENDENT_AMBULATORY_CARE_PROVIDER_SITE_OTHER): Payer: Medicare Other | Admitting: *Deleted

## 2014-08-16 DIAGNOSIS — Z5181 Encounter for therapeutic drug level monitoring: Secondary | ICD-10-CM

## 2014-08-16 DIAGNOSIS — Z7901 Long term (current) use of anticoagulants: Secondary | ICD-10-CM | POA: Diagnosis not present

## 2014-08-16 DIAGNOSIS — Z8679 Personal history of other diseases of the circulatory system: Secondary | ICD-10-CM

## 2014-08-16 DIAGNOSIS — I4891 Unspecified atrial fibrillation: Secondary | ICD-10-CM

## 2014-08-16 DIAGNOSIS — I482 Chronic atrial fibrillation: Secondary | ICD-10-CM

## 2014-08-16 DIAGNOSIS — I4821 Permanent atrial fibrillation: Secondary | ICD-10-CM

## 2014-08-16 LAB — POCT INR: INR: 1.9

## 2014-09-05 ENCOUNTER — Telehealth: Payer: Self-pay | Admitting: Internal Medicine
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
## 2014-09-07 NOTE — Telephone Encounter (Signed)
Pt was due for blood work this past January and an ov in February. Letters were mailed.  There is nothing else in the chart at this time.

## 2014-09-08 ENCOUNTER — Ambulatory Visit (INDEPENDENT_AMBULATORY_CARE_PROVIDER_SITE_OTHER): Payer: Medicare Other | Admitting: Pharmacist

## 2014-09-08 DIAGNOSIS — I4891 Unspecified atrial fibrillation: Secondary | ICD-10-CM

## 2014-09-08 DIAGNOSIS — Z7901 Long term (current) use of anticoagulants: Secondary | ICD-10-CM | POA: Diagnosis not present

## 2014-09-08 DIAGNOSIS — I482 Chronic atrial fibrillation: Secondary | ICD-10-CM

## 2014-09-08 DIAGNOSIS — Z5181 Encounter for therapeutic drug level monitoring: Secondary | ICD-10-CM

## 2014-09-08 DIAGNOSIS — Z8679 Personal history of other diseases of the circulatory system: Secondary | ICD-10-CM | POA: Diagnosis not present

## 2014-09-08 DIAGNOSIS — I4821 Permanent atrial fibrillation: Secondary | ICD-10-CM

## 2014-09-08 LAB — POCT INR: INR: 2.7

## 2014-10-05 ENCOUNTER — Telehealth: Payer: Self-pay | Admitting: *Deleted

## 2014-10-05 NOTE — Telephone Encounter (Signed)
Fax number to [facility] is  [phone]

## 2014-10-05 NOTE — Telephone Encounter (Signed)
Information faxed to [facility]

## 2014-10-05 NOTE — Telephone Encounter (Signed)
Patients daughter called per request by Tammy with [facility] in [place] to ask that orders be sent over for coumadin check and dose. Also requesting last several pt/inr readings

## 2014-10-13 ENCOUNTER — Ambulatory Visit (INDEPENDENT_AMBULATORY_CARE_PROVIDER_SITE_OTHER): Payer: Medicare Other | Admitting: *Deleted

## 2014-10-13 DIAGNOSIS — Z5181 Encounter for therapeutic drug level monitoring: Secondary | ICD-10-CM

## 2014-10-13 DIAGNOSIS — I482 Chronic atrial fibrillation: Secondary | ICD-10-CM

## 2014-10-13 DIAGNOSIS — Z7901 Long term (current) use of anticoagulants: Secondary | ICD-10-CM

## 2014-10-13 DIAGNOSIS — I4891 Unspecified atrial fibrillation: Secondary | ICD-10-CM

## 2014-10-13 DIAGNOSIS — I4821 Permanent atrial fibrillation: Secondary | ICD-10-CM

## 2014-10-13 DIAGNOSIS — Z8679 Personal history of other diseases of the circulatory system: Secondary | ICD-10-CM | POA: Diagnosis not present

## 2014-10-13 LAB — POCT INR: INR: 4.1

## 2014-10-25 ENCOUNTER — Ambulatory Visit (INDEPENDENT_AMBULATORY_CARE_PROVIDER_SITE_OTHER): Payer: Medicare Other | Admitting: *Deleted

## 2014-10-25 DIAGNOSIS — I4891 Unspecified atrial fibrillation: Secondary | ICD-10-CM | POA: Diagnosis not present

## 2014-10-25 DIAGNOSIS — Z7901 Long term (current) use of anticoagulants: Secondary | ICD-10-CM

## 2014-10-25 DIAGNOSIS — Z5181 Encounter for therapeutic drug level monitoring: Secondary | ICD-10-CM | POA: Diagnosis not present

## 2014-10-25 DIAGNOSIS — I4821 Permanent atrial fibrillation: Secondary | ICD-10-CM

## 2014-10-25 DIAGNOSIS — Z8679 Personal history of other diseases of the circulatory system: Secondary | ICD-10-CM | POA: Diagnosis not present

## 2014-10-25 DIAGNOSIS — I482 Chronic atrial fibrillation: Secondary | ICD-10-CM

## 2014-10-25 LAB — POCT INR: INR: 2.4

## 2014-10-26 ENCOUNTER — Telehealth: Payer: Self-pay | Admitting: *Deleted

## 2014-10-26 NOTE — Telephone Encounter (Signed)
Mrs. Youkhana can not come on the week of 6/21 due to lack of transporation. Please advise.  Please call # [phone] and ask for Amy or Levada Dy to advise if it is ok for her to wait Another week.

## 2014-10-26 NOTE — Telephone Encounter (Signed)
Appt rescheduled for 11/10/14.  [facility] notified

## 2014-11-22 ENCOUNTER — Ambulatory Visit (INDEPENDENT_AMBULATORY_CARE_PROVIDER_SITE_OTHER): Payer: Medicare Other | Admitting: *Deleted

## 2014-11-22 DIAGNOSIS — Z8679 Personal history of other diseases of the circulatory system: Secondary | ICD-10-CM

## 2014-11-22 DIAGNOSIS — Z5181 Encounter for therapeutic drug level monitoring: Secondary | ICD-10-CM | POA: Diagnosis not present

## 2014-11-22 DIAGNOSIS — I4891 Unspecified atrial fibrillation: Secondary | ICD-10-CM | POA: Diagnosis not present

## 2014-11-22 DIAGNOSIS — Z7901 Long term (current) use of anticoagulants: Secondary | ICD-10-CM | POA: Diagnosis not present

## 2014-11-22 DIAGNOSIS — I482 Chronic atrial fibrillation: Secondary | ICD-10-CM

## 2014-11-22 DIAGNOSIS — I4821 Permanent atrial fibrillation: Secondary | ICD-10-CM

## 2014-11-22 LAB — POCT INR: INR: 1.4

## 2014-12-06 ENCOUNTER — Ambulatory Visit (INDEPENDENT_AMBULATORY_CARE_PROVIDER_SITE_OTHER): Payer: Medicare Other | Admitting: *Deleted

## 2014-12-06 DIAGNOSIS — Z7901 Long term (current) use of anticoagulants: Secondary | ICD-10-CM | POA: Diagnosis not present

## 2014-12-06 DIAGNOSIS — I482 Chronic atrial fibrillation: Secondary | ICD-10-CM

## 2014-12-06 DIAGNOSIS — I4891 Unspecified atrial fibrillation: Secondary | ICD-10-CM

## 2014-12-06 DIAGNOSIS — Z8679 Personal history of other diseases of the circulatory system: Secondary | ICD-10-CM

## 2014-12-06 DIAGNOSIS — I4821 Permanent atrial fibrillation: Secondary | ICD-10-CM

## 2014-12-06 DIAGNOSIS — Z5181 Encounter for therapeutic drug level monitoring: Secondary | ICD-10-CM

## 2014-12-06 LAB — POCT INR: INR: 1.5

## 2014-12-15 ENCOUNTER — Ambulatory Visit (INDEPENDENT_AMBULATORY_CARE_PROVIDER_SITE_OTHER): Payer: Medicare Other | Admitting: *Deleted

## 2014-12-15 DIAGNOSIS — I4891 Unspecified atrial fibrillation: Secondary | ICD-10-CM

## 2014-12-15 DIAGNOSIS — Z7901 Long term (current) use of anticoagulants: Secondary | ICD-10-CM

## 2014-12-15 DIAGNOSIS — I4821 Permanent atrial fibrillation: Secondary | ICD-10-CM

## 2014-12-15 DIAGNOSIS — Z5181 Encounter for therapeutic drug level monitoring: Secondary | ICD-10-CM | POA: Diagnosis not present

## 2014-12-15 DIAGNOSIS — Z8679 Personal history of other diseases of the circulatory system: Secondary | ICD-10-CM

## 2014-12-15 DIAGNOSIS — I482 Chronic atrial fibrillation: Secondary | ICD-10-CM

## 2014-12-15 LAB — POCT INR: INR: 1.8

## 2014-12-22 ENCOUNTER — Ambulatory Visit (INDEPENDENT_AMBULATORY_CARE_PROVIDER_SITE_OTHER): Payer: Medicare Other | Admitting: *Deleted

## 2014-12-22 ENCOUNTER — Encounter: Payer: Self-pay | Admitting: Cardiology

## 2014-12-22 ENCOUNTER — Ambulatory Visit (INDEPENDENT_AMBULATORY_CARE_PROVIDER_SITE_OTHER): Payer: Medicare Other | Admitting: Cardiology

## 2014-12-22 VITALS — BP 127/76 | HR 111 | Ht 64.0 in | Wt 96.0 lb

## 2014-12-22 DIAGNOSIS — I4821 Permanent atrial fibrillation: Secondary | ICD-10-CM

## 2014-12-22 DIAGNOSIS — I319 Disease of pericardium, unspecified: Secondary | ICD-10-CM | POA: Diagnosis not present

## 2014-12-22 DIAGNOSIS — Z5181 Encounter for therapeutic drug level monitoring: Secondary | ICD-10-CM

## 2014-12-22 DIAGNOSIS — Z8679 Personal history of other diseases of the circulatory system: Secondary | ICD-10-CM | POA: Diagnosis not present

## 2014-12-22 DIAGNOSIS — I4891 Unspecified atrial fibrillation: Secondary | ICD-10-CM

## 2014-12-22 DIAGNOSIS — I3139 Other pericardial effusion (noninflammatory): Secondary | ICD-10-CM

## 2014-12-22 DIAGNOSIS — I482 Chronic atrial fibrillation: Secondary | ICD-10-CM

## 2014-12-22 DIAGNOSIS — I313 Pericardial effusion (noninflammatory): Secondary | ICD-10-CM

## 2014-12-22 DIAGNOSIS — Z7901 Long term (current) use of anticoagulants: Secondary | ICD-10-CM | POA: Diagnosis not present

## 2014-12-22 LAB — POCT INR: INR: 2.2

## 2014-12-22 NOTE — Progress Notes (Signed)
Cardiology Office Note  Date: 12/22/2014   ID: Katherine Mckinney, DOB [birth year], MRN [id number]  PCP: Dr. Renata Caprice [facility])  Primary Cardiologist: Rozann Lesches, MD   Chief Complaint  Patient presents with  . Atrial Fibrillation    History of Present Illness: Katherine Mckinney is an 81 y.o. female last seen in December 2015. She is here today for a follow-up visit with an Environmental consultant from [place]. She came in for her Coumadin check, and did not realize that she had a visit with me. She is very anxious today, shaking at times, seems very worried about her loss of vision over time, also very fidgety. My understanding is that she saw her psychologist today at [facility] and is going to have some medication adjustments to assist with this. Her primary care provider is Dr. Edison Pace who visits her facility (she previously saw Dr. Willey Blade).  She continues on Coumadin, followed in the anticoagulation clinic. INR was therapeutic at 2.2 today. Heart rate was initially elevated at 111, however when she sat down and settled down, it dropped into the 80s on my examination. She continues on Toprol-XL.   Past Medical History  Diagnosis Date  . Chronic atrial fibrillation   . Hyperlipemia   . Coronary atherosclerosis of native coronary artery     Minimal at cardiac catheterization in 04/1998  . Uterine carcinoma     TAH/BSO in 07/2000  . Glaucoma   . Depression with anxiety   . Hypothyroidism   . GERD (gastroesophageal reflux disease)     Esophageal dilatation for stricture 2013    Current Outpatient Prescriptions  Medication Sig Dispense Refill  . acetaminophen (TYLENOL) 500 MG tablet Take 1,000 mg by mouth 3 (three) times daily as needed. For pain/fever    . beta carotene w/minerals (OCUVITE) tablet Take 2 tablets by mouth daily.     . hydrocortisone (ANUSOL-HC) 2.5 % rectal cream Place 1 application rectally daily as needed for hemorrhoids or itching.    . lamoTRIgine (LAMICTAL) 25 MG tablet Take  25 mg by mouth 2 (two) times daily.    Marland Kitchen levothyroxine (SYNTHROID, LEVOTHROID) 50 MCG tablet Take 50 mcg by mouth daily.    Marland Kitchen LORazepam (ATIVAN) 0.5 MG tablet Take 0.25 mg by mouth every 4 (four) hours as needed.     . metoprolol succinate (TOPROL-XL) 50 MG 24 hr tablet Take 50 mg by mouth daily.     . mirtazapine (REMERON) 15 MG tablet Take 15 mg by mouth at bedtime.    . pantoprazole (PROTONIX) 40 MG tablet Take 40 mg by mouth daily.    . polyethylene glycol (MIRALAX / GLYCOLAX) packet Take 17 g by mouth daily as needed.    . risperiDONE (RISPERDAL) 0.5 MG tablet Take 0.5 mg by mouth at bedtime.    . senna (SENOKOT) 8.6 MG TABS tablet Take 2 tablets by mouth daily.    . sertraline (ZOLOFT) 25 MG tablet Take 25 mg by mouth daily.    . Vitamins A & D (BAZA CLEAR) OINT Apply 1 application topically 2 (two) times daily as needed.    . warfarin (COUMADIN) 4 MG tablet Take 1 tablet (4 mg total) by mouth daily. Take 4mg  on Sundays, Tuesdays, Thursdays and Saturdays 30 tablet 3   No current facility-administered medications for this visit.    Allergies:  Codeine   Social History: The patient  reports that she has never smoked. She has never used smokeless tobacco. She reports that she does  not drink alcohol or use illicit drugs.   ROS:  Please see the history of present illness. Otherwise, complete review of systems is positive for significant anxiety.  All other systems are reviewed and negative.   Physical Exam: VS:  BP 127/76 mmHg  Pulse 111  Ht 5\' 4"  (1.626 m)  Wt 96 lb (43.545 kg)  BMI 16.47 kg/m2, BMI Body mass index is 16.47 kg/(m^2).  Wt Readings from Last 3 Encounters:  12/22/14 96 lb (43.545 kg)  04/29/14 116 lb (52.617 kg)  04/20/14 114 lb 6.4 oz (51.891 kg)     Elderly woman, significantly anxious today. HEENT: Conjunctiva and lids normal, oropharynx clear.  Neck: Supple, no elevated JVP or carotid bruits, no thyromegaly.  Lungs: Clear to auscultation, nonlabored  breathing at rest.  Cardiac: Irregularly irregular, no S3, soft systolic murmur, no pericardial rub.  Abdomen: Soft, nontender, bowel sounds present.  Extremities: No pitting edema, distal pulses 2+.  Skin: Warm and dry.  Musculoskeletal: No kyphosis.    ECG: ECG is not ordered today.  Other Studies Reviewed Today:  Echocardiogram 04/27/2014: Study Conclusions  - Left ventricle: The cavity size was normal. Wall thickness was increased in a pattern of mild LVH. Systolic function was normal. The estimated ejection fraction was in the range of 60% to 65%. Wall motion was normal; there were no regional wall motion abnormalities. - Aortic valve: Mildly calcified annulus. Trileaflet; mildly calcified leaflets. - Aortic root: The aortic root was mildly ectatic. - Mitral valve: Calcified annulus. - Left atrium: The atrium was severely dilated. - Right atrium: The atrium was severely dilated. - Pericardium, extracardiac: A moderate pericardial effusion was identified circumferential to the heart. No definite evidence of tamponade physiology. No significant respiratory variation in mitral inflow. Mild undulation of the right atrial free wall.  Impressions:  - Limited study to reassess pericardial effusion. There is mild LVH with LVEF 60-65%. Sclerotic aortic valve and MAC noted. Severe biatrial enlargement. A moderate pericardial effusion was identified circumferential to the heart. No definite evidence of tamponade physiology. No significant respiratory variation in mitral inflow. Mild undulation of the right atrial free wall.   Assessment and Plan:  1. Chronic atrial fibrillation. She can use on Coumadin with therapeutic INR today. No change in Toprol-XL dose.  2. Moderate pericardial effusion, has been clinically stable. Echocardiogram from December 2015 noted above.  Current medicines were reviewed with the patient today.  Disposition: FU with  me in 6 months.   Signed, Satira Sark, MD, [facility] 12/22/2014 5:01 PM    [facility] [place], [place], [place] [address] Phone: [phone]; Fax: [phone]

## 2014-12-22 NOTE — Patient Instructions (Signed)
Your physician recommends that you continue on your current medications as directed. Please refer to the Current Medication list given to you today. Your physician recommends that you schedule a follow-up appointment in: 6 months. You will receive a reminder letter in the mail in about 4 months reminding you to call and schedule your appointment. If you don't receive this letter, please contact our office. 

## 2015-01-10 ENCOUNTER — Ambulatory Visit (INDEPENDENT_AMBULATORY_CARE_PROVIDER_SITE_OTHER): Payer: Medicare Other | Admitting: *Deleted

## 2015-01-10 DIAGNOSIS — I482 Chronic atrial fibrillation: Secondary | ICD-10-CM

## 2015-01-10 DIAGNOSIS — Z7901 Long term (current) use of anticoagulants: Secondary | ICD-10-CM | POA: Diagnosis not present

## 2015-01-10 DIAGNOSIS — I4821 Permanent atrial fibrillation: Secondary | ICD-10-CM

## 2015-01-10 DIAGNOSIS — I4891 Unspecified atrial fibrillation: Secondary | ICD-10-CM

## 2015-01-10 DIAGNOSIS — Z8679 Personal history of other diseases of the circulatory system: Secondary | ICD-10-CM | POA: Diagnosis not present

## 2015-01-10 DIAGNOSIS — Z5181 Encounter for therapeutic drug level monitoring: Secondary | ICD-10-CM | POA: Diagnosis not present

## 2015-01-10 LAB — POCT INR: INR: 2.3

## 2015-02-07 ENCOUNTER — Ambulatory Visit (INDEPENDENT_AMBULATORY_CARE_PROVIDER_SITE_OTHER): Payer: Medicare Other | Admitting: *Deleted

## 2015-02-07 DIAGNOSIS — I4891 Unspecified atrial fibrillation: Secondary | ICD-10-CM | POA: Diagnosis not present

## 2015-02-07 DIAGNOSIS — Z8679 Personal history of other diseases of the circulatory system: Secondary | ICD-10-CM | POA: Diagnosis not present

## 2015-02-07 DIAGNOSIS — Z7901 Long term (current) use of anticoagulants: Secondary | ICD-10-CM

## 2015-02-07 DIAGNOSIS — Z5181 Encounter for therapeutic drug level monitoring: Secondary | ICD-10-CM | POA: Diagnosis not present

## 2015-02-07 DIAGNOSIS — I4821 Permanent atrial fibrillation: Secondary | ICD-10-CM

## 2015-02-07 DIAGNOSIS — I482 Chronic atrial fibrillation: Secondary | ICD-10-CM

## 2015-02-07 LAB — POCT INR: INR: 1.9

## 2015-02-08 ENCOUNTER — Telehealth: Payer: Self-pay | Admitting: *Deleted

## 2015-02-08 NOTE — Telephone Encounter (Signed)
Spoke with Levada Dy.  Pt did not get extra 2mg  of coumadin last night as ordered.  Was overlooked by [facility] staff.  Order faxed for pt to receive coumadin 8mg  tonight then resume 4 mg daily except 6mg  on M,W,F.

## 2015-02-08 NOTE — Telephone Encounter (Signed)
Please call [facility] @ above number in regards to this patient. / tg

## 2015-03-07 ENCOUNTER — Ambulatory Visit (INDEPENDENT_AMBULATORY_CARE_PROVIDER_SITE_OTHER): Payer: Medicare Other | Admitting: *Deleted

## 2015-03-07 DIAGNOSIS — Z8679 Personal history of other diseases of the circulatory system: Secondary | ICD-10-CM

## 2015-03-07 DIAGNOSIS — I4891 Unspecified atrial fibrillation: Secondary | ICD-10-CM | POA: Diagnosis not present

## 2015-03-07 DIAGNOSIS — I482 Chronic atrial fibrillation: Secondary | ICD-10-CM

## 2015-03-07 DIAGNOSIS — Z5181 Encounter for therapeutic drug level monitoring: Secondary | ICD-10-CM | POA: Diagnosis not present

## 2015-03-07 DIAGNOSIS — I4821 Permanent atrial fibrillation: Secondary | ICD-10-CM

## 2015-03-07 DIAGNOSIS — Z7901 Long term (current) use of anticoagulants: Secondary | ICD-10-CM

## 2015-03-07 LAB — POCT INR: INR: 1.9

## 2015-03-08 ENCOUNTER — Emergency Department (HOSPITAL_COMMUNITY): Payer: Medicare Other

## 2015-03-08 ENCOUNTER — Observation Stay (HOSPITAL_COMMUNITY)
Admission: EM | Admit: 2015-03-08 | Discharge: 2015-03-08 | Disposition: A | Discharge status: Other Acute Inpatient Hospital | Payer: Medicare Other | Source: Home / Self Care | Attending: Emergency Medicine | Admitting: Emergency Medicine

## 2015-03-08 ENCOUNTER — Encounter (HOSPITAL_COMMUNITY): Payer: Self-pay | Admitting: Emergency Medicine

## 2015-03-08 DIAGNOSIS — I4821 Permanent atrial fibrillation: Secondary | ICD-10-CM | POA: Diagnosis present

## 2015-03-08 DIAGNOSIS — I482 Chronic atrial fibrillation: Secondary | ICD-10-CM | POA: Diagnosis not present

## 2015-03-08 DIAGNOSIS — IMO0001 Reserved for inherently not codable concepts without codable children: Secondary | ICD-10-CM

## 2015-03-08 DIAGNOSIS — S3289XD Fracture of other parts of pelvis, subsequent encounter for fracture with routine healing: Secondary | ICD-10-CM

## 2015-03-08 DIAGNOSIS — E785 Hyperlipidemia, unspecified: Secondary | ICD-10-CM | POA: Diagnosis not present

## 2015-03-08 DIAGNOSIS — S329XXA Fracture of unspecified parts of lumbosacral spine and pelvis, initial encounter for closed fracture: Secondary | ICD-10-CM

## 2015-03-08 DIAGNOSIS — N390 Urinary tract infection, site not specified: Secondary | ICD-10-CM | POA: Diagnosis not present

## 2015-03-08 DIAGNOSIS — Z7901 Long term (current) use of anticoagulants: Secondary | ICD-10-CM | POA: Diagnosis not present

## 2015-03-08 DIAGNOSIS — R319 Hematuria, unspecified: Secondary | ICD-10-CM | POA: Diagnosis not present

## 2015-03-08 LAB — COMPREHENSIVE METABOLIC PANEL
ALT: 20 U/L (ref 14–54)
AST: 26 U/L (ref 15–41)
Albumin: 3.6 g/dL (ref 3.5–5.0)
Alkaline Phosphatase: 51 U/L (ref 38–126)
Anion gap: 6 (ref 5–15)
BILIRUBIN TOTAL: 0.5 mg/dL (ref 0.3–1.2)
BUN: 21 mg/dL — ABNORMAL HIGH (ref 6–20)
CHLORIDE: 107 mmol/L (ref 101–111)
CO2: 27 mmol/L (ref 22–32)
CREATININE: 1 mg/dL (ref 0.44–1.00)
Calcium: 9 mg/dL (ref 8.9–10.3)
GFR calc Af Amer: 57 mL/min — ABNORMAL LOW (ref >60)
GFR, EST NON AFRICAN AMERICAN: 49 mL/min — AB (ref >60)
Glucose, Bld: 94 mg/dL (ref 65–99)
POTASSIUM: 3.8 mmol/L (ref 3.5–5.1)
Sodium: 140 mmol/L (ref 135–145)
TOTAL PROTEIN: 6.3 g/dL — AB (ref 6.5–8.1)

## 2015-03-08 LAB — CBC WITH DIFFERENTIAL/PLATELET
BASOS ABS: 0 10*3/uL (ref 0.0–0.1)
Basophils Relative: 0 %
EOS ABS: 0 10*3/uL (ref 0.0–0.7)
EOS PCT: 0 %
HCT: 36.1 % (ref 36.0–46.0)
HEMOGLOBIN: 12.1 g/dL (ref 12.0–15.0)
LYMPHS ABS: 1.7 10*3/uL (ref 0.7–4.0)
LYMPHS PCT: 20 %
MCH: 31.6 pg (ref 26.0–34.0)
MCHC: 33.5 g/dL (ref 30.0–36.0)
MCV: 94.3 fL (ref 78.0–100.0)
Monocytes Absolute: 0.6 10*3/uL (ref 0.1–1.0)
Monocytes Relative: 7 %
NEUTROS PCT: 73 %
Neutro Abs: 6.1 10*3/uL (ref 1.7–7.7)
PLATELETS: 185 10*3/uL (ref 150–400)
RBC: 3.83 MIL/uL — AB (ref 3.87–5.11)
RDW: 14.1 % (ref 11.5–15.5)
WBC: 8.4 10*3/uL (ref 4.0–10.5)

## 2015-03-08 LAB — URINALYSIS, ROUTINE W REFLEX MICROSCOPIC
Bilirubin Urine: NEGATIVE
GLUCOSE, UA: NEGATIVE mg/dL
Hgb urine dipstick: NEGATIVE
KETONES UR: NEGATIVE mg/dL
NITRITE: NEGATIVE
PROTEIN: NEGATIVE mg/dL
Specific Gravity, Urine: <1.005 — ABNORMAL LOW (ref 1.005–1.030)
UROBILINOGEN UA: 0.2 mg/dL (ref 0.0–1.0)
pH: 7 (ref 5.0–8.0)

## 2015-03-08 LAB — TSH: TSH: 2.722 u[IU]/mL (ref 0.350–4.500)

## 2015-03-08 LAB — URINE MICROSCOPIC-ADD ON

## 2015-03-08 LAB — MRSA PCR SCREENING: MRSA BY PCR: NEGATIVE

## 2015-03-08 MED ORDER — MIRTAZAPINE 15 MG PO TABS: 15.0000 mg | ORAL_TABLET | Freq: Every day | ORAL | Status: DC

## 2015-03-08 MED ORDER — TRAMADOL HCL 50 MG PO TABS
50.0000 mg | ORAL_TABLET | Freq: Four times a day (QID) | ORAL | Status: AC | PRN
Start: 2015-03-08 — End: ?

## 2015-03-08 MED ORDER — ONDANSETRON HCL 4 MG/2ML IJ SOLN: 4.0000 mg | Freq: Four times a day (QID) | INTRAMUSCULAR | Status: DC | PRN

## 2015-03-08 MED ORDER — OCUVITE PO TABS
2.0000 | ORAL_TABLET | Freq: Every day | ORAL | Status: DC
  Administered 2015-03-08: 2 via ORAL
  Filled 2015-03-08 (×3): qty 2

## 2015-03-08 MED ORDER — PANTOPRAZOLE SODIUM 40 MG PO TBEC
40.0000 mg | DELAYED_RELEASE_TABLET | Freq: Every day | ORAL | Status: DC
  Administered 2015-03-08: 40 mg via ORAL
  Filled 2015-03-08: qty 1

## 2015-03-08 MED ORDER — WARFARIN - PHYSICIAN DOSING INPATIENT: Freq: Every day | Status: DC

## 2015-03-08 MED ORDER — POLYETHYLENE GLYCOL 3350 17 G PO PACK
17.0000 g | PACK | Freq: Every day | ORAL | Status: DC
  Administered 2015-03-08: 17 g via ORAL
  Filled 2015-03-08: qty 1

## 2015-03-08 MED ORDER — SODIUM CHLORIDE 0.9 % IJ SOLN
3.0000 mL | Freq: Two times a day (BID) | INTRAMUSCULAR | Status: DC
  Administered 2015-03-08: 3 mL via INTRAVENOUS

## 2015-03-08 MED ORDER — METOPROLOL SUCCINATE ER 50 MG PO TB24
50.0000 mg | ORAL_TABLET | Freq: Every day | ORAL | Status: DC
  Administered 2015-03-08: 50 mg via ORAL
  Filled 2015-03-08: qty 1

## 2015-03-08 MED ORDER — FENTANYL CITRATE (PF) 100 MCG/2ML IJ SOLN
25.0000 ug | INTRAMUSCULAR | Status: DC | PRN
  Administered 2015-03-08 (×2): 25 ug via INTRAVENOUS
  Filled 2015-03-08 (×2): qty 2

## 2015-03-08 MED ORDER — ACETAMINOPHEN 325 MG PO TABS: 325.0000 mg | ORAL_TABLET | Freq: Four times a day (QID) | ORAL | Status: DC | PRN

## 2015-03-08 MED ORDER — ONDANSETRON HCL 4 MG PO TABS: 4.0000 mg | ORAL_TABLET | Freq: Four times a day (QID) | ORAL | Status: DC | PRN

## 2015-03-08 MED ORDER — LEVOTHYROXINE SODIUM 50 MCG PO TABS
50.0000 ug | ORAL_TABLET | Freq: Every day | ORAL | Status: DC
Start: 2015-03-08 — End: 2015-03-08
  Administered 2015-03-08: 50 ug via ORAL
  Filled 2015-03-08: qty 1

## 2015-03-08 MED ORDER — SERTRALINE HCL 50 MG PO TABS
25.0000 mg | ORAL_TABLET | Freq: Every day | ORAL | Status: DC
  Administered 2015-03-08: 25 mg via ORAL
  Filled 2015-03-08: qty 1

## 2015-03-08 MED ORDER — WARFARIN SODIUM 2 MG PO TABS
4.0000 mg | ORAL_TABLET | Freq: Every day | ORAL | Status: DC
  Administered 2015-03-08: 4 mg via ORAL
  Filled 2015-03-08: qty 2

## 2015-03-08 MED ORDER — FENTANYL CITRATE (PF) 100 MCG/2ML IJ SOLN
25.0000 ug | Freq: Once | INTRAMUSCULAR | Status: AC
  Administered 2015-03-08: 25 ug via INTRAVENOUS
  Filled 2015-03-08: qty 2

## 2015-03-08 NOTE — ED Notes (Signed)
Pt states she has a living will and it states she does not want to be resuscitated if her heart stops and she told Dr. Marin Comment that if her heart stops she does not wish for it to be restarted, this nurse was in room and acted as the witness

## 2015-03-08 NOTE — ED Notes (Signed)
Pt called this nurse in room, very anxious, stating she needs something for pain; Dr. Tomi Bamberger informed and order for pain meds given, when this nurse went in room to give medications, pt is asleep; meds held until pt is awake

## 2015-03-08 NOTE — Care Management Note (Signed)
Case Management Note  Patient Details  Name: Katherine Mckinney MRN: [id number] Date of Birth: [birth year]  Subjective/Objective:                  Pt is from [facility], admitted for pelvic fx.   Action/Plan: Pt discharging back to [facility] with PT/OT. CSW is aware of DC plan and will see pt and arrange for return to facility. Pt will have PT/OT provided by [facility]. No CM needs noted at this time.   Expected Discharge Date:  03/11/15               Expected Discharge Plan:  Assisted Living / Rest Home  In-House Referral:  Clinical Social Work  Discharge planning Services  CM Consult  Post Acute Care Choice:  Home Health Choice offered to:     DME Arranged:    DME Agency:     HH Arranged:  PT, OT [place] Agency:  Other - See comment  Status of Service:  Completed, signed off  Medicare Important Message Given:    Date Medicare IM Given:    Medicare IM give by:    Date Additional Medicare IM Given:    Additional Medicare Important Message give by:     If discussed at [place] of Stay Meetings, dates discussed:    Additional Comments:  Sherald Barge, RN 03/08/2015, 10:22 AM

## 2015-03-08 NOTE — Progress Notes (Signed)
Discharged to [facility], packet sent with transporter, out via w/c with staff.

## 2015-03-08 NOTE — Evaluation (Signed)
Physical Therapy Evaluation Patient Details Name: Katherine Mckinney MRN: [id number] DOB: 30-Mar-1925 Today's Date: 03/08/2015   History of Present Illness  Katherine Mckinney is an 81 y.o. female with hx of chronic afib on Coumadin, HLD, CAD, hypothyroidism, depression and anxiety, resident of assited living facility, fell and presented to the ER with pain in her hip. X ray showed left superior and inferior displaced rami Fx. She has pain upon ambulation, and after her fall, she had to crawl. Serology was unremarkable, and her Hb was stable. Hospitalist was asked to admit her due to the fact that she lives in an assisted living, and may not be able to perform ADL, along with controlling her pain. She expressed desire to be a DNR.   Clinical Impression   Pt was seen for evaluation.  She was found supine in the bed with extreme anxiety.  She states that this is a chronic problem and she is getting instruction for breathing at [facility].  She is very frail in body habitus but is able to move both LEs against gravity.  She is able to pull herself up to sitting in the bed independently and was then able to sit at EOB with good stability.  She needed min assist to stand to a walker but could not tolerate any weight bearing on the LLE.  For this reason, she is unable to ambulate at this time.  She was able to transfer bed to chair with min assist.  Pt should be able to transition back to [facility] and HHPT.      Follow Up Recommendations Home health PT    Equipment Recommendations  None recommended by PT    Recommendations for Other Services       Precautions / Restrictions Precautions Precautions: Fall Restrictions Weight Bearing Restrictions: No Other Position/Activity Restrictions: pt has pain with weight bearing left      Mobility  Bed Mobility Overal bed mobility: Needs Assistance Bed Mobility: Supine to Sit     Supine to sit: Min guard     General bed mobility comments: pt able to scoot to  EOB  without assist  Transfers Overall transfer level: Needs assistance Equipment used: Rolling walker (2 wheeled) Transfers: Sit to/from [facility] Sit to Stand: Min assist Stand pivot transfers: Min assist       General transfer comment: pt describes severe pain with any weight bearing on the LLE and is unable to take any steps because of this...she is too anxious to try to work through the pain  Ambulation/Gait             General Gait Details: unable to ambulate  Stairs            Wheelchair Mobility    Modified Rankin (Stroke Patients Only)       Balance Overall balance assessment:  (good sitting balance, unable to assess standing balance)                                           Pertinent Vitals/Pain Pain Assessment: 0-10 Pain Location: left pelvis, unable to describe level of pain...had received pain med 1 1/2 hours ago...extreme anxiety seen in pt Pain Descriptors / Indicators: Discomfort;Guarding Pain Intervention(s): Limited activity within patient's tolerance;Premedicated before session    Home Living Family/patient expects to be discharged to:: Assisted living  Home Equipment: Walker - 2 wheels Additional Comments: otherwise unknown    Prior Function Level of Independence: Independent with assistive device(s)         Comments: may have needed assist with bathing     Hand Dominance        Extremity/Trunk Assessment               Lower Extremity Assessment: LLE deficits/detail   LLE Deficits / Details: able to move LLE against gravity but strength is limited by pain  Cervical / Trunk Assessment: Kyphotic  Communication   Communication: No difficulties  Cognition Arousal/Alertness: Awake/alert Behavior During Therapy: Anxious Overall Cognitive Status: Within Functional Limits for tasks assessed                      General Comments      Exercises General  Exercises - Lower Extremity Ankle Circles/Pumps: AROM;AAROM;Both;10 reps;Supine Heel Slides: AAROM;Both;10 reps;Supine Hip ABduction/ADduction: AAROM;Both;10 reps;Supine      Assessment/Plan    PT Assessment All further PT needs can be met in the next venue of care  PT Diagnosis Difficulty walking;Acute pain   PT Problem List Decreased activity tolerance;Decreased mobility;Pain  PT Treatment Interventions     PT Goals (Current goals can be found in the Care Plan section) Acute Rehab PT Goals PT Goal Formulation: All assessment and education complete, DC therapy    Frequency     Barriers to discharge        Co-evaluation               End of Session Equipment Utilized During Treatment: Gait belt Activity Tolerance: Patient limited by pain Patient left: in chair;with call bell/phone within reach;with chair alarm set Nurse Communication: Mobility status    Functional Assessment Tool Used: clinical judgement Functional Limitation: Mobility: Walking and moving around Mobility: Walking and Moving Around Current Status (C7893): At least 40 percent but less than 60 percent impaired, limited or restricted Mobility: Walking and Moving Around Goal Status [phone]): At least 40 percent but less than 60 percent impaired, limited or restricted Mobility: Walking and Moving Around Discharge Status [phone]): At least 40 percent but less than 60 percent impaired, limited or restricted    Time: 0838-0927 PT Time Calculation (min) (ACUTE ONLY): 49 min   Charges:   PT Evaluation $Initial PT Evaluation Tier I: 1 Procedure     PT G Codes:   PT G-Codes **NOT FOR INPATIENT CLASS** Functional Assessment Tool Used: clinical judgement Functional Limitation: Mobility: Walking and moving around Mobility: Walking and Moving Around Current Status (E5277): At least 40 percent but less than 60 percent impaired, limited or restricted Mobility: Walking and Moving Around Goal Status [phone]): At  least 40 percent but less than 60 percent impaired, limited or restricted Mobility: Walking and Moving Around Discharge Status [phone]): At least 40 percent but less than 60 percent impaired, limited or restricted    Sable Feil  PT 03/08/2015, 10:20 AM [phone]

## 2015-03-08 NOTE — ED Provider Notes (Signed)
CSN: [id number]     Arrival date & time 03/08/15  0041 History  By signing my name below, I, Helane Gunther, attest that this documentation has been prepared under the direction and in the presence of Rolland Porter, MD at [place]. Electronically Signed: Helane Gunther, ED Scribe. 03/08/2015. 12:52 AM.    Chief Complaint  Patient presents with  . Hip Pain   The history is provided by the patient and the EMS personnel. No language interpreter was used.   HPI Comments: Katherine Mckinney is a 81 y.o. female who presents to the Emergency Department complaining of constant, aching, left hip pain onset after a fall that occurred just PTA. Pt states she was walking from the bathroom back towards her bed when her feet came out from under her, causing her to fall on her left side. She denies hitting her head or LOC. She notes she was not able to walk after the fall and had to drag herself to the bed. Pt is in an assisted living facility since May 1st of this year. She denies smoking or drinking alcohol. She denies a PMHx of hip problems.   Per EMS pt called the staff into her room and told them she had fallen and was now experiencing left hip pain.   PCP not known by patient.   Past Medical History  Diagnosis Date  . Chronic atrial fibrillation ([place])   . Hyperlipemia   . Coronary atherosclerosis of native coronary artery     Minimal at cardiac catheterization in 04/1998  . Uterine carcinoma (HCC)     TAH/BSO in 07/2000  . Glaucoma   . Depression with anxiety   . Hypothyroidism   . GERD (gastroesophageal reflux disease)     Esophageal dilatation for stricture 2013   Past Surgical History  Procedure Laterality Date  . Tonsillectomy    . Appendectomy    . Breast excisional biopsy  1997    Right  . Total abdominal hysterectomy w/ bilateral salpingoophorectomy  07/2000    Neoplastic disease  . Colonoscopy  01/23/07    Diminutive rectal polyp/scattered sigmoid diverticula/tubular adenoma. Due 01/2012  .  Esophagogastroduodenoscopy  08/14/2011    Dr. Gala Romney: mild erosive reflux esophagitis, probable subtle ring at GE junction s/p Maloney dilation. small hiatal hernia  . Maloney dilation  08/14/2011    Procedure: Venia Minks DILATION;  Surgeon: Daneil Dolin, MD;  [facility];  Service: Endoscopy;  Laterality: N/A;   Family History  Problem Relation Age of Onset  . Heart attack Mother     H/o CAD in 94 brothers as well  . Lung cancer Father     Pneumoconiosis  . Coronary artery disease Brother     +3 other brothers, 2 with acute MI  . Colon cancer Neg Hx    Social History  Substance Use Topics  . Smoking status: Never Smoker   . Smokeless tobacco: Never Used     Comment: Never smoked  . Alcohol Use: No  lives in ALF  OB History    No data available     Review of Systems  Musculoskeletal: Positive for arthralgias.  Neurological: Negative for syncope.  All other systems reviewed and are negative.   Allergies  Codeine  Home Medications   Prior to Admission medications   Medication Sig Start Date End Date Taking? Authorizing Provider  acetaminophen (TYLENOL) 500 MG tablet Take 1,000 mg by mouth 3 (three) times daily as needed. For pain/fever   Yes  Historical Provider, MD  beta carotene w/minerals (OCUVITE) tablet Take 2 tablets by mouth daily.    Yes Historical Provider, MD  hydrocortisone (ANUSOL-HC) 2.5 % rectal cream Place 1 application rectally daily as needed for hemorrhoids or itching.   Yes Historical Provider, MD  levothyroxine (SYNTHROID, LEVOTHROID) 50 MCG tablet Take 50 mcg by mouth daily.   Yes Historical Provider, MD  LORazepam (ATIVAN) 0.5 MG tablet Take 0.25 mg by mouth every 4 (four) hours as needed.    Yes Historical Provider, MD  metoprolol succinate (TOPROL-XL) 50 MG 24 hr tablet Take 50 mg by mouth daily.    Yes Historical Provider, MD  mirtazapine (REMERON) 15 MG tablet Take 15 mg by mouth at bedtime.   Yes Historical Provider, MD  pantoprazole  (PROTONIX) 40 MG tablet Take 40 mg by mouth daily.   Yes Historical Provider, MD  polyethylene glycol (MIRALAX / GLYCOLAX) packet Take 17 g by mouth daily as needed.   Yes Historical Provider, MD  senna (SENOKOT) 8.6 MG TABS tablet Take 2 tablets by mouth daily.   Yes Historical Provider, MD  sertraline (ZOLOFT) 25 MG tablet Take 25 mg by mouth daily.   Yes Historical Provider, MD  Vitamins A & D (BAZA CLEAR) OINT Apply 1 application topically 2 (two) times daily as needed.   Yes Historical Provider, MD  warfarin (COUMADIN) 4 MG tablet Take 1 tablet (4 mg total) by mouth daily. Take 4mg  on Sundays, Tuesdays, Thursdays and Saturdays 05/24/13  Yes Herminio Commons, MD  lamoTRIgine (LAMICTAL) 25 MG tablet Take 25 mg by mouth 2 (two) times daily.    Historical Provider, MD  risperiDONE (RISPERDAL) 0.5 MG tablet Take 0.5 mg by mouth at bedtime.    Historical Provider, MD   BP 117/89 mmHg  Pulse 83  Temp(Src) 97.8 F (36.6 C) (Oral)  Resp 20  Ht 5\' 6"  (1.676 m)  Wt 102 lb (46.267 kg)  BMI 16.47 kg/m2  SpO2 99%  Vital signs normal   Physical Exam  Constitutional: She is oriented to person, place, and time.  Non-toxic appearance. She does not appear ill. No distress.  Elderly frail female  HENT:  Head: Normocephalic and atraumatic.  Right Ear: External ear normal.  Left Ear: External ear normal.  Nose: Nose normal. No mucosal edema or rhinorrhea.  Mouth/Throat: Oropharynx is clear and moist and mucous membranes are normal. No dental abscesses or uvula swelling.  Eyes: Conjunctivae and EOM are normal. Pupils are equal, round, and reactive to light.  Neck: Normal range of motion and full passive range of motion without pain. Neck supple.  Cardiovascular: Normal rate, regular rhythm and normal heart sounds.  Exam reveals no gallop and no friction rub.   No murmur heard. Pulmonary/Chest: Effort normal and breath sounds normal. No respiratory distress. She has no wheezes. She has no rhonchi.  She has no rales. She exhibits no tenderness and no crepitus.  Abdominal: Soft. Normal appearance and bowel sounds are normal. She exhibits no distension. There is no tenderness. There is no rebound and no guarding.  Musculoskeletal: Normal range of motion. She exhibits no edema or tenderness.  No internal or external rotation of the L lower leg, no shortening of the L leg. Has pain in her left hip when she tries to flex her hip.   Neurological: She is alert and oriented to person, place, and time. She has normal strength. No cranial nerve deficit.  Skin: Skin is warm, dry and intact. No rash noted.  No erythema. There is pallor.  Psychiatric: She has a normal mood and affect. Her speech is normal and behavior is normal. Her mood appears not anxious.  Nursing note and vitals reviewed.   ED Course  Procedures   Medications  fentaNYL (SUBLIMAZE) injection 25 mcg (not administered)    DIAGNOSTIC STUDIES: Oxygen Saturation is 99% on RA, normal by my interpretation.    COORDINATION OF CARE: 12:52 AM - Discussed plans to order diagnostic imaging. Pt advised of plan for treatment and pt agrees.  Pt did not want pain medication at this time.   01:50 After reviewing patient's x-ray a Foley catheter was inserted to assist patient with using the bathroom since she will not be able to weight-bear for a period of time. Patient was given the results of her x-ray. She did not seem to understand.  03:15 Nursing staff states patient is now asking for pain medication.   03:22 Dr Marin Comment, will see patient.   Labs Review Results for orders placed or performed during the hospital encounter of 03/08/15  Comprehensive metabolic panel  Result Value Ref Range   Sodium 140 135 - 145 mmol/L   Potassium 3.8 3.5 - 5.1 mmol/L   Chloride 107 101 - 111 mmol/L   CO2 27 22 - 32 mmol/L   Glucose, Bld 94 65 - 99 mg/dL   BUN 21 (H) 6 - 20 mg/dL   Creatinine, Ser 1.00 0.44 - 1.00 mg/dL   Calcium 9.0 8.9 - 10.3 mg/dL    Total Protein 6.3 (L) 6.5 - 8.1 g/dL   Albumin 3.6 3.5 - 5.0 g/dL   AST 26 15 - 41 U/L   ALT 20 14 - 54 U/L   Alkaline Phosphatase 51 38 - 126 U/L   Total Bilirubin 0.5 0.3 - 1.2 mg/dL   GFR calc non Af Amer 49 (L) >60 mL/min   GFR calc Af Amer 57 (L) >60 mL/min   Anion gap 6 5 - 15  CBC with Differential  Result Value Ref Range   WBC 8.4 4.0 - 10.5 K/uL   RBC 3.83 (L) 3.87 - 5.11 MIL/uL   Hemoglobin 12.1 12.0 - 15.0 g/dL   HCT 36.1 36.0 - 46.0 %   MCV 94.3 78.0 - 100.0 fL   MCH 31.6 26.0 - 34.0 pg   MCHC 33.5 30.0 - 36.0 g/dL   RDW 14.1 11.5 - 15.5 %   Platelets 185 150 - 400 K/uL   Neutrophils Relative % 73 %   Neutro Abs 6.1 1.7 - 7.7 K/uL   Lymphocytes Relative 20 %   Lymphs Abs 1.7 0.7 - 4.0 K/uL   Monocytes Relative 7 %   Monocytes Absolute 0.6 0.1 - 1.0 K/uL   Eosinophils Relative 0 %   Eosinophils Absolute 0.0 0.0 - 0.7 K/uL   Basophils Relative 0 %   Basophils Absolute 0.0 0.0 - 0.1 K/uL  Urinalysis, Routine w reflex microscopic (not at [facility])  Result Value Ref Range   Color, Urine YELLOW YELLOW   APPearance CLEAR CLEAR   Specific Gravity, Urine <1.005 (L) 1.005 - 1.030   pH 7.0 5.0 - 8.0   Glucose, UA NEGATIVE NEGATIVE mg/dL   Hgb urine dipstick NEGATIVE NEGATIVE   Bilirubin Urine NEGATIVE NEGATIVE   Ketones, ur NEGATIVE NEGATIVE mg/dL   Protein, ur NEGATIVE NEGATIVE mg/dL   Urobilinogen, UA 0.2 0.0 - 1.0 mg/dL   Nitrite NEGATIVE NEGATIVE   Leukocytes, UA SMALL (A) NEGATIVE  Urine microscopic-add on  Result  Value Ref Range   Squamous Epithelial / LPF RARE RARE   WBC, UA 0-2 <3 WBC/hpf   RBC / HPF 0-2 <3 RBC/hpf   Bacteria, UA MANY (A) RARE    Laboratory interpretation all normal except bactiuria    Imaging Review Dg Hip Unilat With Pelvis 2-3 Views Left  03/08/2015  CLINICAL DATA:  Fall at assisted living facility tonight while walking back from bathroom. Now with left groin pain. EXAM: DG HIP (WITH OR WITHOUT PELVIS) 2-3V LEFT COMPARISON:  None.  FINDINGS: There are displaced fractures of the left superior and inferior pubic rami without acetabular extension. The left hip joint remains congruent, left femoral head is normally located. No additional acute fracture. Degenerative change of both hips, right greater than left. The bones appear under mineralized. Vascular calcifications are seen. IMPRESSION: Displaced fractures of the left superior and inferior pubic rami. No evidence of acetabular involvement. Electronically Signed   By: Jeb Levering M.D.   On: 03/08/2015 01:36   I have personally reviewed and evaluated these images and lab results as part of my medical decision-making.   EKG Interpretation None      MDM   Final diagnoses:  Pelvic fracture, closed, initial encounter   Plan admission  Rolland Porter, MD, Barbette Or, MD 03/08/15 [phone]

## 2015-03-08 NOTE — H&P (Signed)
[facility] History and Physical  Katherine Mckinney UXN:[id number] DOB: 1924/12/02    PCP:   Renata Caprice, DO   Chief Complaint: fell and suffered a pelvic rim Fx.   HPI: Katherine Mckinney is an 81 y.o. female with hx of chronic afib on Coumadin, HLD, CAD, hypothyroidism, depression and anxiety, resident of assited living facility, fell and presented to the ER with pain in her hip.  X ray showed left superior and inferior displaced rami Fx.  She has pain upon ambulation, and after her fall, she had to crawl.  Serology was unremarkable, and her Hb was stable.  Hospitalist was asked to admit her due to the fact that she lives in an assisted living, and may not be able to perform ADL, along with controlling her pain.  She expressed desire to be a DNR.   Rewiew of Systems:  Constitutional: Negative for malaise, fever and chills. No significant weight loss or weight gain Eyes: Negative for eye pain, redness and discharge, diplopia, visual changes, or flashes of light. ENMT: Negative for ear pain, hoarseness, nasal congestion, sinus pressure and sore throat. No headaches; tinnitus, drooling, or problem swallowing. Cardiovascular: Negative for chest pain, palpitations, diaphoresis, dyspnea and peripheral edema. ; No orthopnea, PND Respiratory: Negative for cough, hemoptysis, wheezing and stridor. No pleuritic chestpain. Gastrointestinal: Negative for nausea, vomiting, diarrhea, constipation, abdominal pain, melena, blood in stool, hematemesis, jaundice and rectal bleeding.    Genitourinary: Negative for frequency, dysuria, incontinence,flank pain and hematuria; Musculoskeletal: Negative for back pain and neck pain. Negative for swelling and trauma.;  Skin: . Negative for pruritus, rash, abrasions, bruising and skin lesion.; ulcerations Neuro: Negative for headache, lightheadedness and neck stiffness. Negative for weakness, altered level of consciousness , altered mental status, extremity weakness, burning  feet, involuntary movement, seizure and syncope.  Psych: negative for anxiety, depression, insomnia, tearfulness, panic attacks, hallucinations, paranoia, suicidal or homicidal ideation    Past Medical History  Diagnosis Date  . Chronic atrial fibrillation ([place])   . Hyperlipemia   . Coronary atherosclerosis of native coronary artery     Minimal at cardiac catheterization in 04/1998  . Uterine carcinoma (HCC)     TAH/BSO in 07/2000  . Glaucoma   . Depression with anxiety   . Hypothyroidism   . GERD (gastroesophageal reflux disease)     Esophageal dilatation for stricture 2013    Past Surgical History  Procedure Laterality Date  . Tonsillectomy    . Appendectomy    . Breast excisional biopsy  1997    Right  . Total abdominal hysterectomy w/ bilateral salpingoophorectomy  07/2000    Neoplastic disease  . Colonoscopy  01/23/07    Diminutive rectal polyp/scattered sigmoid diverticula/tubular adenoma. Due 01/2012  . Esophagogastroduodenoscopy  08/14/2011    Dr. Gala Romney: mild erosive reflux esophagitis, probable subtle ring at GE junction s/p Maloney dilation. small hiatal hernia  . Maloney dilation  08/14/2011    Procedure: Venia Minks DILATION;  Surgeon: Daneil Dolin, MD;  [facility];  Service: Endoscopy;  Laterality: N/A;    Medications:  HOME MEDS: Prior to Admission medications   Medication Sig Start Date End Date Taking? Authorizing Provider  acetaminophen (TYLENOL) 500 MG tablet Take 1,000 mg by mouth 3 (three) times daily as needed. For pain/fever   Yes Historical Provider, MD  beta carotene w/minerals (OCUVITE) tablet Take 2 tablets by mouth daily.    Yes Historical Provider, MD  hydrocortisone (ANUSOL-HC) 2.5 % rectal cream Place 1 application rectally  daily as needed for hemorrhoids or itching.   Yes Historical Provider, MD  levothyroxine (SYNTHROID, LEVOTHROID) 50 MCG tablet Take 50 mcg by mouth daily.   Yes Historical Provider, MD  LORazepam (ATIVAN) 0.5 MG tablet  Take 0.25 mg by mouth every 4 (four) hours as needed.    Yes Historical Provider, MD  metoprolol succinate (TOPROL-XL) 50 MG 24 hr tablet Take 50 mg by mouth daily.    Yes Historical Provider, MD  mirtazapine (REMERON) 15 MG tablet Take 15 mg by mouth at bedtime.   Yes Historical Provider, MD  pantoprazole (PROTONIX) 40 MG tablet Take 40 mg by mouth daily.   Yes Historical Provider, MD  polyethylene glycol (MIRALAX / GLYCOLAX) packet Take 17 g by mouth daily as needed.   Yes Historical Provider, MD  senna (SENOKOT) 8.6 MG TABS tablet Take 2 tablets by mouth daily.   Yes Historical Provider, MD  sertraline (ZOLOFT) 25 MG tablet Take 25 mg by mouth daily.   Yes Historical Provider, MD  Vitamins A & D (BAZA CLEAR) OINT Apply 1 application topically 2 (two) times daily as needed.   Yes Historical Provider, MD  warfarin (COUMADIN) 4 MG tablet Take 1 tablet (4 mg total) by mouth daily. Take 4mg  on Sundays, Tuesdays, Thursdays and Saturdays 05/24/13  Yes Herminio Commons, MD  lamoTRIgine (LAMICTAL) 25 MG tablet Take 25 mg by mouth 2 (two) times daily.    Historical Provider, MD  risperiDONE (RISPERDAL) 0.5 MG tablet Take 0.5 mg by mouth at bedtime.    Historical Provider, MD     Allergies:  Allergies  Allergen Reactions  . Codeine     REACTION: nausea and emesis    Social History:   reports that she has never smoked. She has never used smokeless tobacco. She reports that she does not drink alcohol or use illicit drugs.  Family History: Family History  Problem Relation Age of Onset  . Heart attack Mother     H/o CAD in 21 brothers as well  . Lung cancer Father     Pneumoconiosis  . Coronary artery disease Brother     +3 other brothers, 2 with acute MI  . Colon cancer Neg Hx      Physical Exam: Filed Vitals:   03/08/15 0047  BP: 117/89  Pulse: 83  Temp: 97.8 F (36.6 C)  TempSrc: Oral  Resp: 20  Height: 5\' 6"  (1.676 m)  Weight: 46.267 kg (102 lb)  SpO2: 99%   Blood pressure  117/89, pulse 83, temperature 97.8 F (36.6 C), temperature source Oral, resp. rate 20, height 5\' 6"  (1.676 m), weight 46.267 kg (102 lb), SpO2 99 %.  GEN:  Pleasant  patient lying in the stretcher in no acute distress; cooperative with exam. PSYCH:  alert and oriented x4; does not appear anxious or depressed; affect is appropriate. HEENT: Mucous membranes pink and anicteric; PERRLA; EOM intact; no cervical lymphadenopathy nor thyromegaly or carotid bruit; no JVD; There were no stridor. Neck is very supple. Breasts:: Not examined CHEST WALL: No tenderness CHEST: Normal respiration, clear to auscultation bilaterally.  HEART: Regular rate and rhythm.  There are no murmur, rub, or gallops.   BACK: No kyphosis or scoliosis; no CVA tenderness ABDOMEN: soft and non-tender; no masses, no organomegaly, normal abdominal bowel sounds; no pannus; no intertriginous candida. There is no rebound and no distention. Rectal Exam: Not done EXTREMITIES: No bone or joint deformity; age-appropriate arthropathy of the hands and knees; no edema; no ulcerations.  There is no calf tenderness. Genitalia: not examined PULSES: 2+ and symmetric SKIN: Normal hydration no rash or ulceration CNS: Cranial nerves 2-12 grossly intact no focal lateralizing neurologic deficit.  Speech is fluent; uvula elevated with phonation, facial symmetry and tongue midline. DTR are normal bilaterally, cerebella exam is intact, barbinski is negative and strengths are equaled bilaterally.  No sensory loss.   Labs on Admission:  Basic Metabolic Panel:  Recent Labs Lab 03/08/15 0216  NA 140  K 3.8  CL 107  CO2 27  GLUCOSE 94  BUN 21*  CREATININE 1.00  CALCIUM 9.0   Liver Function Tests:  Recent Labs Lab 03/08/15 0216  AST 26  ALT 20  ALKPHOS 51  BILITOT 0.5  PROT 6.3*  ALBUMIN 3.6   CBC:  Recent Labs Lab 03/08/15 0216  WBC 8.4  NEUTROABS 6.1  HGB 12.1  HCT 36.1  MCV 94.3  PLT 185    Radiological Exams on  Admission: Dg Hip Unilat With Pelvis 2-3 Views Left  03/08/2015  CLINICAL DATA:  Fall at assisted living facility tonight while walking back from bathroom. Now with left groin pain. EXAM: DG HIP (WITH OR WITHOUT PELVIS) 2-3V LEFT COMPARISON:  None. FINDINGS: There are displaced fractures of the left superior and inferior pubic rami without acetabular extension. The left hip joint remains congruent, left femoral head is normally located. No additional acute fracture. Degenerative change of both hips, right greater than left. The bones appear under mineralized. Vascular calcifications are seen. IMPRESSION: Displaced fractures of the left superior and inferior pubic rami. No evidence of acetabular involvement. Electronically Signed   By: Jeb Levering M.D.   On: 03/08/2015 01:36    EKG: Independently reviewed.   Assessment/Plan Present on Admission:  . Hyperlipidemia . Permanent atrial fibrillation (HCC) Pelvic rim Fx Anticoagulation DNR.  PLAN:  Will admit her for PT, along with pain control.  Will consult PT.  For her pain, will use IV Fentanyl since she has allergy to codeine. Will continue with her anticoagulation with Coumadin, and obtain daily INR.  She is stable, and will be admitted to [facility] service.  She is a DNR.   Other plans as per orders.  Code Status: DNR.   Orvan Falconer, MD. [facility] Pager [phone] 7pm to 7am.  03/08/2015, 4:13 AM
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
## 2015-03-08 NOTE — ED Notes (Signed)
Pt from [facility] states pt may have fallen they are not sure. Pt c/o left hip pain.

## 2015-03-08 NOTE — ED Notes (Signed)
Report given to Marion RN on 300 

## 2015-03-08 NOTE — Discharge Summary (Signed)
Physician Discharge Summary  CONITA AMENTA FVC:[id number] DOB: 11-22-1924 DOA: 03/08/2015  PCP: Renata Caprice, DO  Admit date: 03/08/2015 Discharge date: 03/08/2015  Time spent: 45 minutes  Recommendations for Outpatient Follow-up:  -Will be discharged back to ALF today with [facility] services. -Advised to follow up with PCP in 2 weeks. -Needs continued management for her anxiety disorder.   Discharge Diagnoses:  Principal Problem:   Pelvic rim fracture ([place]) Active Problems:   Hyperlipidemia   Permanent atrial fibrillation (HCC)   Chronic anticoagulation   Discharge Condition: Stable  Filed Weights   03/08/15 0047  Weight: 46.267 kg (102 lb)    History of present illness:  Katherine Mckinney is an 81 y.o. female with hx of chronic afib on Coumadin, HLD, CAD, hypothyroidism, depression and anxiety, resident of assited living facility, fell and presented to the ER with pain in her hip. X ray showed left superior and inferior displaced rami Fx. She has pain upon ambulation, and after her fall, she had to crawl. Serology was unremarkable, and her Hb was stable. Hospitalist was asked to admit her due to the fact that she lives in an assisted living, and may not be able to perform ADL, along with controlling her pain. She expressed desire to be a DNR.   Hospital Course:   Pelvic Fracture -Non-operative management. -Seen by PT. -She should do well at ALF, we will add [facility] services to assist her. -weight bearing as tolerated.  Permanent A fib -Rate controlled. -Continue coumadin.  Procedures:  None   Consultations:  None  Discharge Instructions  Discharge Instructions    Increase activity slowly    Complete by:  As directed             Medication List    TAKE these medications        acetaminophen 500 MG tablet  Commonly known as:  TYLENOL  Take 1,000 mg by mouth 3 (three) times daily as needed. For pain/fever     BAZA CLEAR Oint  Apply 1 application topically  2 (two) times daily as needed.     beta carotene w/minerals tablet  Take 2 tablets by mouth daily.     hydrocortisone 2.5 % rectal cream  Commonly known as:  ANUSOL-HC  Place 1 application rectally daily as needed for hemorrhoids or itching.     lamoTRIgine 25 MG tablet  Commonly known as:  LAMICTAL  Take 25 mg by mouth 2 (two) times daily.     levothyroxine 50 MCG tablet  Commonly known as:  SYNTHROID, LEVOTHROID  Take 50 mcg by mouth daily.     LORazepam 0.5 MG tablet  Commonly known as:  ATIVAN  Take 0.25 mg by mouth every 4 (four) hours as needed.     metoprolol succinate 50 MG 24 hr tablet  Commonly known as:  TOPROL-XL  Take 50 mg by mouth daily.     mirtazapine 15 MG tablet  Commonly known as:  REMERON  Take 15 mg by mouth at bedtime.     pantoprazole 40 MG tablet  Commonly known as:  PROTONIX  Take 40 mg by mouth daily.     polyethylene glycol packet  Commonly known as:  MIRALAX / GLYCOLAX  Take 17 g by mouth daily as needed.     risperiDONE 0.5 MG tablet  Commonly known as:  RISPERDAL  Take 0.5 mg by mouth at bedtime.     senna 8.6 MG Tabs tablet  Commonly known as:  SENOKOT  Take 2 tablets by mouth daily.     sertraline 25 MG tablet  Commonly known as:  ZOLOFT  Take 25 mg by mouth daily.     traMADol 50 MG tablet  Commonly known as:  ULTRAM  Take 1 tablet (50 mg total) by mouth every 6 (six) hours as needed for moderate pain.     warfarin 4 MG tablet  Commonly known as:  COUMADIN  Take 1 tablet (4 mg total) by mouth daily. Take 4mg  on Sundays, Tuesdays, Thursdays and Saturdays       Allergies  Allergen Reactions  . Codeine     REACTION: nausea and emesis       Follow-up Information    Follow up with Renata Caprice, DO. Schedule an appointment as soon as possible for a visit in 2 weeks.   Specialty:  Family Medicine   Contact information:   [place] [address] [phone]        The results of significant diagnostics  from this hospitalization (including imaging, microbiology, ancillary and laboratory) are listed below for reference.    Significant Diagnostic Studies: Dg Hip Unilat With Pelvis 2-3 Views Left  03/08/2015  CLINICAL DATA:  Fall at assisted living facility tonight while walking back from bathroom. Now with left groin pain. EXAM: DG HIP (WITH OR WITHOUT PELVIS) 2-3V LEFT COMPARISON:  None. FINDINGS: There are displaced fractures of the left superior and inferior pubic rami without acetabular extension. The left hip joint remains congruent, left femoral head is normally located. No additional acute fracture. Degenerative change of both hips, right greater than left. The bones appear under mineralized. Vascular calcifications are seen. IMPRESSION: Displaced fractures of the left superior and inferior pubic rami. No evidence of acetabular involvement. Electronically Signed   By: Jeb Levering M.D.   On: 03/08/2015 01:36    Microbiology: Recent Results (from the past 240 hour(s))  MRSA PCR Screening     Status: None   Collection Time: 03/08/15  6:44 AM  Result Value Ref Range Status   MRSA by PCR NEGATIVE NEGATIVE Final    Comment:        The GeneXpert MRSA Assay (FDA approved for NASAL specimens only), is one component of a comprehensive MRSA colonization surveillance program. It is not intended to diagnose MRSA infection nor to guide or monitor treatment for MRSA infections.      Labs: Basic Metabolic Panel:  Recent Labs Lab 03/08/15 0216  NA 140  K 3.8  CL 107  CO2 27  GLUCOSE 94  BUN 21*  CREATININE 1.00  CALCIUM 9.0   Liver Function Tests:  Recent Labs Lab 03/08/15 0216  AST 26  ALT 20  ALKPHOS 51  BILITOT 0.5  PROT 6.3*  ALBUMIN 3.6   No results for input(s): LIPASE, AMYLASE in the last 168 hours. No results for input(s): AMMONIA in the last 168 hours. CBC:  Recent Labs Lab 03/08/15 0216  WBC 8.4  NEUTROABS 6.1  HGB 12.1  HCT 36.1  MCV 94.3  PLT  185   Cardiac Enzymes: No results for input(s): CKTOTAL, CKMB, CKMBINDEX, TROPONINI in the last 168 hours. BNP: BNP (last 3 results) No results for input(s): BNP in the last 8760 hours.  ProBNP (last 3 results) No results for input(s): PROBNP in the last 8760 hours.  CBG: No results for input(s): GLUCAP in the last 168 hours.     SignedLelon Frohlich  [facility] Pager: [phone] 03/08/2015,  9:50 AM

## 2015-03-10 ENCOUNTER — Inpatient Hospital Stay (HOSPITAL_COMMUNITY)
Admission: EM | Admit: 2015-03-10 | Discharge: 2015-03-13 | DRG: 690 | Disposition: A | Discharge status: Home Health | Payer: Medicare Other | Attending: Internal Medicine | Admitting: Internal Medicine

## 2015-03-10 ENCOUNTER — Emergency Department (HOSPITAL_COMMUNITY): Payer: Medicare Other

## 2015-03-10 ENCOUNTER — Encounter (HOSPITAL_COMMUNITY): Payer: Self-pay | Admitting: Cardiology

## 2015-03-10 DIAGNOSIS — S3289XA Fracture of other parts of pelvis, initial encounter for closed fracture: Secondary | ICD-10-CM | POA: Diagnosis present

## 2015-03-10 DIAGNOSIS — I4821 Permanent atrial fibrillation: Secondary | ICD-10-CM | POA: Diagnosis present

## 2015-03-10 DIAGNOSIS — S3210XA Unspecified fracture of sacrum, initial encounter for closed fracture: Secondary | ICD-10-CM

## 2015-03-10 DIAGNOSIS — Z7901 Long term (current) use of anticoagulants: Secondary | ICD-10-CM

## 2015-03-10 DIAGNOSIS — E785 Hyperlipidemia, unspecified: Secondary | ICD-10-CM | POA: Diagnosis present

## 2015-03-10 DIAGNOSIS — N179 Acute kidney failure, unspecified: Secondary | ICD-10-CM | POA: Diagnosis present

## 2015-03-10 DIAGNOSIS — D72829 Elevated white blood cell count, unspecified: Secondary | ICD-10-CM

## 2015-03-10 DIAGNOSIS — Z8249 Family history of ischemic heart disease and other diseases of the circulatory system: Secondary | ICD-10-CM

## 2015-03-10 DIAGNOSIS — I2510 Atherosclerotic heart disease of native coronary artery without angina pectoris: Secondary | ICD-10-CM | POA: Diagnosis present

## 2015-03-10 DIAGNOSIS — H409 Unspecified glaucoma: Secondary | ICD-10-CM | POA: Diagnosis present

## 2015-03-10 DIAGNOSIS — D62 Acute posthemorrhagic anemia: Secondary | ICD-10-CM | POA: Diagnosis present

## 2015-03-10 DIAGNOSIS — W010XXA Fall on same level from slipping, tripping and stumbling without subsequent striking against object, initial encounter: Secondary | ICD-10-CM | POA: Diagnosis present

## 2015-03-10 DIAGNOSIS — N390 Urinary tract infection, site not specified: Secondary | ICD-10-CM | POA: Diagnosis present

## 2015-03-10 DIAGNOSIS — S32592A Other specified fracture of left pubis, initial encounter for closed fracture: Secondary | ICD-10-CM

## 2015-03-10 DIAGNOSIS — R319 Hematuria, unspecified: Secondary | ICD-10-CM

## 2015-03-10 DIAGNOSIS — Y92121 Bathroom in nursing home as the place of occurrence of the external cause: Secondary | ICD-10-CM

## 2015-03-10 DIAGNOSIS — Z801 Family history of malignant neoplasm of trachea, bronchus and lung: Secondary | ICD-10-CM

## 2015-03-10 DIAGNOSIS — R791 Abnormal coagulation profile: Secondary | ICD-10-CM | POA: Diagnosis present

## 2015-03-10 DIAGNOSIS — E039 Hypothyroidism, unspecified: Secondary | ICD-10-CM | POA: Diagnosis present

## 2015-03-10 DIAGNOSIS — N289 Disorder of kidney and ureter, unspecified: Secondary | ICD-10-CM

## 2015-03-10 DIAGNOSIS — I482 Chronic atrial fibrillation: Secondary | ICD-10-CM | POA: Diagnosis present

## 2015-03-10 DIAGNOSIS — Z66 Do not resuscitate: Secondary | ICD-10-CM | POA: Diagnosis present

## 2015-03-10 DIAGNOSIS — S3289XD Fracture of other parts of pelvis, subsequent encounter for fracture with routine healing: Secondary | ICD-10-CM | POA: Diagnosis not present

## 2015-03-10 DIAGNOSIS — IMO0001 Reserved for inherently not codable concepts without codable children: Secondary | ICD-10-CM | POA: Diagnosis present

## 2015-03-10 DIAGNOSIS — N3001 Acute cystitis with hematuria: Secondary | ICD-10-CM | POA: Diagnosis not present

## 2015-03-10 LAB — CBC WITH DIFFERENTIAL/PLATELET
Basophils Absolute: 0 10*3/uL (ref 0.0–0.1)
Basophils Relative: 0 %
EOS PCT: 0 %
Eosinophils Absolute: 0 10*3/uL (ref 0.0–0.7)
HCT: 32.1 % — ABNORMAL LOW (ref 36.0–46.0)
HEMOGLOBIN: 11 g/dL — AB (ref 12.0–15.0)
LYMPHS ABS: 0.5 10*3/uL — AB (ref 0.7–4.0)
LYMPHS PCT: 3 %
MCH: 31.9 pg (ref 26.0–34.0)
MCHC: 34.3 g/dL (ref 30.0–36.0)
MCV: 93 fL (ref 78.0–100.0)
Monocytes Absolute: 1.1 10*3/uL — ABNORMAL HIGH (ref 0.1–1.0)
Monocytes Relative: 6 %
Neutro Abs: 16.8 10*3/uL — ABNORMAL HIGH (ref 1.7–7.7)
Neutrophils Relative %: 91 %
PLATELETS: 184 10*3/uL (ref 150–400)
RBC: 3.45 MIL/uL — AB (ref 3.87–5.11)
RDW: 14 % (ref 11.5–15.5)
WBC: 18.4 10*3/uL — AB (ref 4.0–10.5)

## 2015-03-10 LAB — URINE MICROSCOPIC-ADD ON

## 2015-03-10 LAB — URINALYSIS, ROUTINE W REFLEX MICROSCOPIC
Glucose, UA: NEGATIVE mg/dL
KETONES UR: NEGATIVE mg/dL
NITRITE: POSITIVE — AB
Specific Gravity, Urine: <1.005 — ABNORMAL LOW (ref 1.005–1.030)
UROBILINOGEN UA: 1 mg/dL (ref 0.0–1.0)
pH: >9 — ABNORMAL HIGH (ref 5.0–8.0)

## 2015-03-10 LAB — PROTIME-INR
INR: 3.35 — ABNORMAL HIGH (ref 0.00–1.49)
Prothrombin Time: 33.2 seconds — ABNORMAL HIGH (ref 11.6–15.2)

## 2015-03-10 LAB — LACTIC ACID, PLASMA
Lactic Acid, Venous: 1.3 mmol/L (ref 0.5–2.0)
Lactic Acid, Venous: 1.4 mmol/L (ref 0.5–2.0)

## 2015-03-10 LAB — BASIC METABOLIC PANEL
Anion gap: 11 (ref 5–15)
BUN: 48 mg/dL — AB (ref 6–20)
CHLORIDE: 103 mmol/L (ref 101–111)
CO2: 21 mmol/L — ABNORMAL LOW (ref 22–32)
Calcium: 8.9 mg/dL (ref 8.9–10.3)
Creatinine, Ser: 1.69 mg/dL — ABNORMAL HIGH (ref 0.44–1.00)
GFR calc Af Amer: 30 mL/min — ABNORMAL LOW (ref >60)
GFR calc non Af Amer: 26 mL/min — ABNORMAL LOW (ref >60)
Glucose, Bld: 169 mg/dL — ABNORMAL HIGH (ref 65–99)
POTASSIUM: 4.1 mmol/L (ref 3.5–5.1)
SODIUM: 135 mmol/L (ref 135–145)

## 2015-03-10 LAB — TYPE AND SCREEN
ABO/RH(D): A POS
ANTIBODY SCREEN: NEGATIVE

## 2015-03-10 LAB — POC OCCULT BLOOD, ED: FECAL OCCULT BLD: POSITIVE — AB

## 2015-03-10 MED ORDER — DEXTROSE 5 % IV SOLN
1.0000 g | INTRAVENOUS | Status: DC
  Filled 2015-03-10 (×2): qty 10

## 2015-03-10 MED ORDER — LEVOTHYROXINE SODIUM 50 MCG PO TABS: 50.0000 ug | ORAL_TABLET | Freq: Every day | ORAL | Status: DC

## 2015-03-10 MED ORDER — ONDANSETRON HCL 4 MG PO TABS: 4.0000 mg | ORAL_TABLET | Freq: Four times a day (QID) | ORAL | Status: DC | PRN

## 2015-03-10 MED ORDER — SENNA 8.6 MG PO TABS
2.0000 | ORAL_TABLET | Freq: Every day | ORAL | Status: DC
  Administered 2015-03-10 – 2015-03-12 (×3): 17.2 mg via ORAL
  Filled 2015-03-10 (×3): qty 2

## 2015-03-10 MED ORDER — SERTRALINE HCL 50 MG PO TABS
50.0000 mg | ORAL_TABLET | Freq: Every day | ORAL | Status: DC
  Administered 2015-03-11 – 2015-03-13 (×3): 50 mg via ORAL
  Filled 2015-03-10 (×3): qty 1

## 2015-03-10 MED ORDER — METOPROLOL SUCCINATE ER 50 MG PO TB24
50.0000 mg | ORAL_TABLET | Freq: Every day | ORAL | Status: DC
  Administered 2015-03-11 – 2015-03-13 (×3): 50 mg via ORAL
  Filled 2015-03-10 (×3): qty 1

## 2015-03-10 MED ORDER — LORAZEPAM 0.5 MG PO TABS
0.2500 mg | ORAL_TABLET | ORAL | Status: DC | PRN
  Administered 2015-03-11 – 2015-03-13 (×5): 0.25 mg via ORAL
  Filled 2015-03-10 (×5): qty 1

## 2015-03-10 MED ORDER — LEVOTHYROXINE SODIUM 50 MCG PO TABS
50.0000 ug | ORAL_TABLET | Freq: Every day | ORAL | Status: DC
  Administered 2015-03-11 – 2015-03-13 (×3): 50 ug via ORAL
  Filled 2015-03-10 (×3): qty 1

## 2015-03-10 MED ORDER — PANTOPRAZOLE SODIUM 40 MG PO TBEC
40.0000 mg | DELAYED_RELEASE_TABLET | Freq: Every day | ORAL | Status: DC
  Administered 2015-03-11 – 2015-03-13 (×3): 40 mg via ORAL
  Filled 2015-03-10 (×3): qty 1

## 2015-03-10 MED ORDER — TRAMADOL HCL 50 MG PO TABS
50.0000 mg | ORAL_TABLET | Freq: Four times a day (QID) | ORAL | Status: DC | PRN
  Administered 2015-03-12: 50 mg via ORAL
  Filled 2015-03-10: qty 1

## 2015-03-10 MED ORDER — ONDANSETRON HCL 4 MG/2ML IJ SOLN: 4.0000 mg | Freq: Four times a day (QID) | INTRAMUSCULAR | Status: DC | PRN

## 2015-03-10 MED ORDER — DEXTROSE 5 % IV SOLN
1.0000 g | INTRAVENOUS | Status: DC
  Administered 2015-03-11 – 2015-03-12 (×2): 1 g via INTRAVENOUS
  Filled 2015-03-10 (×3): qty 10

## 2015-03-10 MED ORDER — SODIUM CHLORIDE 0.9 % IV SOLN: INTRAVENOUS | Status: DC

## 2015-03-10 MED ORDER — SODIUM CHLORIDE 0.9 % IV SOLN
INTRAVENOUS | Status: DC
  Administered 2015-03-10 – 2015-03-13 (×6): via INTRAVENOUS

## 2015-03-10 MED ORDER — QUETIAPINE FUMARATE ER 50 MG PO TB24
150.0000 mg | ORAL_TABLET | Freq: Every day | ORAL | Status: DC
  Administered 2015-03-10 – 2015-03-12 (×3): 150 mg via ORAL
  Filled 2015-03-10 (×5): qty 3

## 2015-03-10 MED ORDER — SERTRALINE HCL 50 MG PO TABS: 50.0000 mg | ORAL_TABLET | Freq: Every day | ORAL | Status: DC

## 2015-03-10 MED ORDER — PANTOPRAZOLE SODIUM 40 MG PO TBEC: 40.0000 mg | DELAYED_RELEASE_TABLET | Freq: Every day | ORAL | Status: DC

## 2015-03-10 MED ORDER — BENZTROPINE MESYLATE 1 MG PO TABS
0.5000 mg | ORAL_TABLET | Freq: Every day | ORAL | Status: DC
  Administered 2015-03-10 – 2015-03-12 (×3): 0.5 mg via ORAL
  Filled 2015-03-10 (×3): qty 1

## 2015-03-10 MED ORDER — DEXTROSE 5 % IV SOLN
1.0000 g | Freq: Once | INTRAVENOUS | Status: AC
  Administered 2015-03-10: 1 g via INTRAVENOUS
  Filled 2015-03-10: qty 10

## 2015-03-10 MED ORDER — ACETAMINOPHEN 325 MG PO TABS: 650.0000 mg | ORAL_TABLET | Freq: Four times a day (QID) | ORAL | Status: DC | PRN

## 2015-03-10 MED ORDER — OCUVITE PO TABS
1.0000 | ORAL_TABLET | Freq: Every day | ORAL | Status: DC
  Administered 2015-03-11 – 2015-03-13 (×3): 1 via ORAL
  Filled 2015-03-10 (×5): qty 1

## 2015-03-10 MED ORDER — SODIUM CHLORIDE 0.9 % IV BOLUS (SEPSIS)
500.0000 mL | Freq: Once | INTRAVENOUS | Status: AC
Start: 2015-03-10 — End: 2015-03-10
  Administered 2015-03-10: 500 mL via INTRAVENOUS

## 2015-03-10 MED ORDER — MIRTAZAPINE 15 MG PO TABS
15.0000 mg | ORAL_TABLET | Freq: Every day | ORAL | Status: DC
  Administered 2015-03-10 – 2015-03-12 (×3): 15 mg via ORAL
  Filled 2015-03-10 (×3): qty 1

## 2015-03-10 NOTE — ED Notes (Signed)
Katherine Mckinney [phone]   Daughter   Pt lives in [place] (formerly [place]).

## 2015-03-10 NOTE — ED Notes (Signed)
I/O done with Kaelyn Nauta Mabe NT and Alecia Lemming NT

## 2015-03-10 NOTE — ED Provider Notes (Signed)
Pt received at sign out with CT A/P pending. Pt from NH with "blood in diaper," found to have +UTI/hematuria, UC pending; IV rocephin given. INR mildly elevated and H/H lower than baseline. BUN/Cr elevated from baseline; judicious IVF given. Foley catheter placed after bladder scan revealed 750-92ml urine. CT scan also reveals non-displaced sacral fracture (related to fall few days ago). Pt DNR per previous admission. T/C to [facility]. Anastasio Champion, case discussed, including:  HPI, pertinent PM/SHx, VS/PE, dx testing, ED course and treatment:  Agreeable to admit, requests to write temporary orders, obtain medical bed to team APAdmits.   Results for orders placed or performed during the hospital encounter of 96/78/93  Basic metabolic panel  Result Value Ref Range   Sodium 135 135 - 145 mmol/L   Potassium 4.1 3.5 - 5.1 mmol/L   Chloride 103 101 - 111 mmol/L   CO2 21 (L) 22 - 32 mmol/L   Glucose, Bld 169 (H) 65 - 99 mg/dL   BUN 48 (H) 6 - 20 mg/dL   Creatinine, Ser 1.69 (H) 0.44 - 1.00 mg/dL   Calcium 8.9 8.9 - 10.3 mg/dL   GFR calc non Af Amer 26 (L) >60 mL/min   GFR calc Af Amer 30 (L) >60 mL/min   Anion gap 11 5 - 15  CBC with Differential/Platelet  Result Value Ref Range   WBC 18.4 (H) 4.0 - 10.5 K/uL   RBC 3.45 (L) 3.87 - 5.11 MIL/uL   Hemoglobin 11.0 (L) 12.0 - 15.0 g/dL   HCT 32.1 (L) 36.0 - 46.0 %   MCV 93.0 78.0 - 100.0 fL   MCH 31.9 26.0 - 34.0 pg   MCHC 34.3 30.0 - 36.0 g/dL   RDW 14.0 11.5 - 15.5 %   Platelets 184 150 - 400 K/uL   Neutrophils Relative % 91 %   Neutro Abs 16.8 (H) 1.7 - 7.7 K/uL   Lymphocytes Relative 3 %   Lymphs Abs 0.5 (L) 0.7 - 4.0 K/uL   Monocytes Relative 6 %   Monocytes Absolute 1.1 (H) 0.1 - 1.0 K/uL   Eosinophils Relative 0 %   Eosinophils Absolute 0.0 0.0 - 0.7 K/uL   Basophils Relative 0 %   Basophils Absolute 0.0 0.0 - 0.1 K/uL  Protime-INR  Result Value Ref Range   Prothrombin Time 33.2 (H) 11.6 - 15.2 seconds   INR 3.35 (H) 0.00 - 1.49   Urinalysis, Routine w reflex microscopic (not at [facility])  Result Value Ref Range   Color, Urine AMBER (A) YELLOW   APPearance HAZY (A) CLEAR   Specific Gravity, Urine <1.005 (L) 1.005 - 1.030   pH >9.0 (H) 5.0 - 8.0   Glucose, UA NEGATIVE NEGATIVE mg/dL   Hgb urine dipstick LARGE (A) NEGATIVE   Bilirubin Urine SMALL (A) NEGATIVE   Ketones, ur NEGATIVE NEGATIVE mg/dL   Protein, ur >300 (A) NEGATIVE mg/dL   Urobilinogen, UA 1.0 0.0 - 1.0 mg/dL   Nitrite POSITIVE (A) NEGATIVE   Leukocytes, UA LARGE (A) NEGATIVE  Urine microscopic-add on  Result Value Ref Range   Squamous Epithelial / LPF FEW (A) RARE   WBC, UA 21-50 <3 WBC/hpf   RBC / HPF TOO NUMEROUS TO COUNT <3 RBC/hpf   Bacteria, UA MANY (A) RARE   Crystals TRIPLE PHOSPHATE CRYSTALS (A) NEGATIVE  Lactic acid, plasma  Result Value Ref Range   Lactic Acid, Venous 1.3 0.5 - 2.0 mmol/L  POC occult blood, ED  Result Value Ref Range   Fecal  Occult Bld POSITIVE (A) NEGATIVE  Type and screen  Result Value Ref Range   ABO/RH(D) A POS    Antibody Screen NEG    Sample Expiration 03/13/2015    Ct Abdomen Pelvis Wo Contrast 03/10/2015  CLINICAL DATA:  Bright red blood per rectum, history of recent pelvic fracture EXAM: CT ABDOMEN AND PELVIS WITHOUT CONTRAST TECHNIQUE: Multidetector CT imaging of the abdomen and pelvis was performed following the standard protocol without IV contrast. COMPARISON:  02/04/2014 FINDINGS: Lung bases are free of acute infiltrate or sizable effusion. The cardiac shadow is enlarged. A pericardial effusion is noted posteriorly as well as along the right lateral margin measuring approximately 16 mm. This is stable from the prior exam. The gallbladder is well distended and demonstrates multiple gallstones. The liver, spleen, adrenal glands and pancreas are within normal limits. In the region of the pancreatic head there is a focus of increased density identified consistent with a distal common bile duct stone. This does  not appear to cause significant biliary obstruction. The kidneys are well visualized bilaterally with mild prominence of the collecting systems and ureters. This may be related to an over distended bladder as no definitive ureteral calculi are seen. Aortoiliac calcifications are seen. Again the bladder is over distended. Fecal material is noted throughout the colon. Patient's known pubic rami fractures on the left are again noted. An undisplaced left sacral fracture is also noted. IMPRESSION: Cholelithiasis as well as choledocholithiasis. No significant biliary ductal dilatation is seen. Stable pericardial effusion. Over distended bladder contributing to fullness in the collecting systems bilaterally. Left pubic rami fractures as well as an undisplaced left sacral fracture which was not well appreciated on the prior plain film examination. Electronically Signed   By: Inez Catalina M.D.   On: 03/10/2015 17:24       Francine Graven, DO 03/10/15 1802

## 2015-03-10 NOTE — ED Notes (Signed)
Ems called out for rectal bleeding

## 2015-03-10 NOTE — ED Provider Notes (Signed)
CSN: [id number]     Arrival date & time 03/10/15  1224 History   First MD Initiated Contact with Patient 03/10/15 1240     Chief Complaint  Patient presents with  . GI Bleeding   LEVEL 5 CAVEAT - ACUITY OF CONDITION  The history is provided by the patient. The history is limited by the condition of the patient.  Patient presents from nursing facility for concern for rectal bleeding.  It is reported that she had blood in stool earlier today.  She is on coumadin Her course is worsening Nothing improves her symptoms Pt just reports pain.  She denies any vomiting.  She is unable to answer any other questions as she reports she is in pain.    Past Medical History  Diagnosis Date  . Chronic atrial fibrillation ([place])   . Hyperlipemia   . Coronary atherosclerosis of native coronary artery     Minimal at cardiac catheterization in 04/1998  . Uterine carcinoma (HCC)     TAH/BSO in 07/2000  . Glaucoma   . Depression with anxiety   . Hypothyroidism   . GERD (gastroesophageal reflux disease)     Esophageal dilatation for stricture 2013   Past Surgical History  Procedure Laterality Date  . Tonsillectomy    . Appendectomy    . Breast excisional biopsy  1997    Right  . Total abdominal hysterectomy w/ bilateral salpingoophorectomy  07/2000    Neoplastic disease  . Colonoscopy  01/23/07    Diminutive rectal polyp/scattered sigmoid diverticula/tubular adenoma. Due 01/2012  . Esophagogastroduodenoscopy  08/14/2011    Dr. Gala Romney: mild erosive reflux esophagitis, probable subtle ring at GE junction s/p Maloney dilation. small hiatal hernia  . Maloney dilation  08/14/2011    Procedure: Venia Minks DILATION;  Surgeon: Daneil Dolin, MD;  Location: [facility];  Service: Endoscopy;  Laterality: N/A;   Family History  Problem Relation Age of Onset  . Heart attack Mother     H/o CAD in 61 brothers as well  . Lung cancer Father     Pneumoconiosis  . Coronary artery disease Brother     +3 other  brothers, 2 with acute MI  . Colon cancer Neg Hx    Social History  Substance Use Topics  . Smoking status: Never Smoker   . Smokeless tobacco: Never Used     Comment: Never smoked  . Alcohol Use: No   OB History    No data available     Review of Systems  Unable to perform ROS: Acuity of condition      Allergies  Codeine  Home Medications   Prior to Admission medications   Medication Sig Start Date End Date Taking? Authorizing Provider  acetaminophen (TYLENOL) 500 MG tablet Take 1,000 mg by mouth 3 (three) times daily as needed. For pain/fever    Historical Provider, MD  beta carotene w/minerals (OCUVITE) tablet Take 2 tablets by mouth daily.     Historical Provider, MD  hydrocortisone (ANUSOL-HC) 2.5 % rectal cream Place 1 application rectally daily as needed for hemorrhoids or itching.    Historical Provider, MD  lamoTRIgine (LAMICTAL) 25 MG tablet Take 25 mg by mouth 2 (two) times daily.    Historical Provider, MD  levothyroxine (SYNTHROID, LEVOTHROID) 50 MCG tablet Take 50 mcg by mouth daily.    Historical Provider, MD  LORazepam (ATIVAN) 0.5 MG tablet Take 0.25 mg by mouth every 4 (four) hours as needed.     Historical Provider, MD  metoprolol succinate (TOPROL-XL) 50 MG 24 hr tablet Take 50 mg by mouth daily.     Historical Provider, MD  mirtazapine (REMERON) 15 MG tablet Take 15 mg by mouth at bedtime.    Historical Provider, MD  pantoprazole (PROTONIX) 40 MG tablet Take 40 mg by mouth daily.    Historical Provider, MD  polyethylene glycol (MIRALAX / GLYCOLAX) packet Take 17 g by mouth daily as needed.    Historical Provider, MD  risperiDONE (RISPERDAL) 0.5 MG tablet Take 0.5 mg by mouth at bedtime.    Historical Provider, MD  senna (SENOKOT) 8.6 MG TABS tablet Take 2 tablets by mouth daily.    Historical Provider, MD  sertraline (ZOLOFT) 25 MG tablet Take 25 mg by mouth daily.    Historical Provider, MD  traMADol (ULTRAM) 50 MG tablet Take 1 tablet (50 mg total) by  mouth every 6 (six) hours as needed for moderate pain. 03/08/15   Estela Leonie Green, MD  Vitamins A & D (BAZA CLEAR) OINT Apply 1 application topically 2 (two) times daily as needed.    Historical Provider, MD  warfarin (COUMADIN) 4 MG tablet Take 1 tablet (4 mg total) by mouth daily. Take 4mg  on Sundays, Tuesdays, Thursdays and Saturdays 05/24/13   Herminio Commons, MD   BP 110/56 mmHg  Pulse 73  Temp(Src) 99.5 F (37.5 C) (Rectal)  Resp 23  SpO2 100% Physical Exam CONSTITUTIONAL: frail elderly HEAD: Normocephalic/atraumatic EYES: EOMI ENMT: Mucous membranes moist NECK: supple no meningeal signs SPINE/BACK:tenderness to sacrum CV: irregular, no loud murmurs LUNGS: Lungs are clear to auscultation bilaterally ABDOMEN: soft, nontender GU:no vaginal bleed noted on bimanual exam (female nurse Aldona Bar present).   Rectal  - brown stool noted, no blood or melena noted NEURO: Pt is awake/alert.  She moans frequently through exam and will not answer all questions SKIN: warm, color normal PSYCH: anxious  ED Course  Procedures  3:25 PM Limited history as pt appears in pain with any movement and does not answer all questions and initially I was told pt had rectal bleeding and was on coumadin so I was concerned for acute GI bleed and limited history obtained On assessment, there is no rectal or vaginal bleeding.  No lacerations noted to perineum Unclear source of blood initially, but in/out cath reveals hematuria 4:04 PM UTI noted Pt stable However she has significant abd tenderness Will need CT imaging for further evaluation of abdominal pain Due to renal insufficiency defer IV contrast D/w dr Thurnell Garbe she will f/u on CT imaging  D/w daughter [name] [phone]  Labs Review Labs Reviewed  BASIC METABOLIC PANEL - Abnormal; Notable for the following:    CO2 21 (*)    Glucose, Bld 169 (*)    BUN 48 (*)    Creatinine, Ser 1.69 (*)    GFR calc non Af Amer 26 (*)     GFR calc Af Amer 30 (*)    All other components within normal limits  CBC WITH DIFFERENTIAL/PLATELET - Abnormal; Notable for the following:    WBC 18.4 (*)    RBC 3.45 (*)    Hemoglobin 11.0 (*)    HCT 32.1 (*)    Neutro Abs 16.8 (*)    Lymphs Abs 0.5 (*)    Monocytes Absolute 1.1 (*)    All other components within normal limits  PROTIME-INR - Abnormal; Notable for the following:    Prothrombin Time 33.2 (*)    INR 3.35 (*)  All other components within normal limits  URINALYSIS, ROUTINE W REFLEX MICROSCOPIC (NOT AT [facility]) - Abnormal; Notable for the following:    Color, Urine AMBER (*)    APPearance HAZY (*)    Specific Gravity, Urine <1.005 (*)    pH >9.0 (*)    Hgb urine dipstick LARGE (*)    Bilirubin Urine SMALL (*)    Protein, ur >300 (*)    Nitrite POSITIVE (*)    Leukocytes, UA LARGE (*)    All other components within normal limits  URINE MICROSCOPIC-ADD ON - Abnormal; Notable for the following:    Squamous Epithelial / LPF FEW (*)    Bacteria, UA MANY (*)    Crystals TRIPLE PHOSPHATE CRYSTALS (*)    All other components within normal limits  POC OCCULT BLOOD, ED - Abnormal; Notable for the following:    Fecal Occult Bld POSITIVE (*)    All other components within normal limits  URINE CULTURE  LACTIC ACID, PLASMA  LACTIC ACID, PLASMA  TYPE AND SCREEN   I have personally reviewed and evaluated these  lab results as part of my medical decision-making.   Medications  cefTRIAXone (ROCEPHIN) 1 g in dextrose 5 % 50 mL IVPB (1 g Intravenous New Bag/Given 03/10/15 1550)  0.9 %  sodium chloride infusion (not administered)  sodium chloride 0.9 % bolus 500 mL (0 mLs Intravenous Stopped 03/10/15 1544)     MDM   Final diagnoses:  UTI (lower urinary tract infection)  Hematuria    Nursing notes including past medical history and social history reviewed and considered in documentation Labs/vital reviewed myself and considered during evaluation Previous records  reviewed and considered     Ripley Fraise, MD 03/10/15 1606

## 2015-03-10 NOTE — ED Notes (Signed)
Bladder scanner showing 750-900 cc of urine.  Pt moaning. Inserted 14 f foley catheter with red tinged urine back.

## 2015-03-10 NOTE — H&P (Signed)
[facility] History and Physical  KATELEEN ENCARNACION GQQ:[id number] DOB: 06-11-1924 DOA: 03/10/2015  Referring physician: ER PCP: Renata Caprice, DO   Chief Complaint: Hematuria  HPI: Katherine Mckinney is a 81 y.o. female  This is an 81 year old lady who was sent from the nursing facility as a possible rectal bleeding. He transpires that she probably has hematuria as there was no blood seen on rectal examination. She has had a Foley catheter inserted and there is plenty of blood seen in the urine. CT scan of the abdomen shows a distended bladder. Urinalysis is consistent with UTI. She says she has been shivering and appears to be very anxious and possibly in pain. There is been no nausea or vomiting. She is on warfarin chronically for chronic atrial fibrillation. She is now being admitted for further management.   Review of Systems:  Apart from symptoms above, all systems are negative.  Past Medical History  Diagnosis Date  . Chronic atrial fibrillation ([place])   . Hyperlipemia   . Coronary atherosclerosis of native coronary artery     Minimal at cardiac catheterization in 04/1998  . Uterine carcinoma (HCC)     TAH/BSO in 07/2000  . Glaucoma   . Depression with anxiety   . Hypothyroidism   . GERD (gastroesophageal reflux disease)     Esophageal dilatation for stricture 2013   Past Surgical History  Procedure Laterality Date  . Tonsillectomy    . Appendectomy    . Breast excisional biopsy  1997    Right  . Total abdominal hysterectomy w/ bilateral salpingoophorectomy  07/2000    Neoplastic disease  . Colonoscopy  01/23/07    Diminutive rectal polyp/scattered sigmoid diverticula/tubular adenoma. Due 01/2012  . Esophagogastroduodenoscopy  08/14/2011    Dr. Gala Romney: mild erosive reflux esophagitis, probable subtle ring at GE junction s/p Maloney dilation. small hiatal hernia  . Maloney dilation  08/14/2011    Procedure: Venia Minks DILATION;  Surgeon: Daneil Dolin, MD;  Location: [facility];   Service: Endoscopy;  Laterality: N/A;   Social History:  reports that she has never smoked. She has never used smokeless tobacco. She reports that she does not drink alcohol or use illicit drugs.  Allergies  Allergen Reactions  . Codeine Nausea And Vomiting    Family History  Problem Relation Age of Onset  . Heart attack Mother     H/o CAD in 65 brothers as well  . Lung cancer Father     Pneumoconiosis  . Coronary artery disease Brother     +3 other brothers, 2 with acute MI  . Colon cancer Neg Hx     Prior to Admission medications   Medication Sig Start Date End Date Taking? Authorizing Provider  acetaminophen (TYLENOL) 325 MG tablet Take 650 mg by mouth every 6 (six) hours as needed for mild pain or moderate pain.   Yes Historical Provider, MD  benztropine (COGENTIN) 0.5 MG tablet Take 0.5 mg by mouth at bedtime.   Yes Historical Provider, MD  levothyroxine (SYNTHROID, LEVOTHROID) 50 MCG tablet Take 50 mcg by mouth daily.   Yes Historical Provider, MD  LORazepam (ATIVAN) 0.5 MG tablet Take 0.25 mg by mouth every 4 (four) hours as needed for anxiety.    Yes Historical Provider, MD  metoprolol succinate (TOPROL-XL) 50 MG 24 hr tablet Take 50 mg by mouth daily.    Yes Historical Provider, MD  mirtazapine (REMERON) 15 MG tablet Take 15 mg by mouth at bedtime.   Yes  Historical Provider, MD  Multiple Vitamins-Minerals (I-VITE PO) Take 1 tablet by mouth daily.   Yes Historical Provider, MD  pantoprazole (PROTONIX) 40 MG tablet Take 40 mg by mouth daily.   Yes Historical Provider, MD  QUEtiapine Fumarate (SEROQUEL XR) 150 MG 24 hr tablet Take 150 mg by mouth at bedtime.   Yes Historical Provider, MD  senna (SENOKOT) 8.6 MG TABS tablet Take 2 tablets by mouth at bedtime.    Yes Historical Provider, MD  sertraline (ZOLOFT) 50 MG tablet Take 50 mg by mouth daily.   Yes Historical Provider, MD  traMADol (ULTRAM) 50 MG tablet Take 1 tablet (50 mg total) by mouth every 6 (six) hours as needed for  moderate pain. 03/08/15  Yes Estela Leonie Green, MD  Vitamins A & D (BAZA CLEAR) OINT Apply 1 application topically 2 (two) times daily as needed.   Yes Historical Provider, MD  warfarin (COUMADIN) 4 MG tablet Take 1 tablet (4 mg total) by mouth daily. Take 4mg  on Sundays, Tuesdays, Thursdays and Saturdays Patient taking differently: Take 4-6 mg by mouth See admin instructions. Take 4mg  on Sundays, Tuesdays, and Thursdays, then take 6mg  on Mondays Wednesdays, Fridays, and Saturdays 05/24/13  Yes Herminio Commons, MD   Physical Exam: Filed Vitals:   03/10/15 1630 03/10/15 1715 03/10/15 1730 03/10/15 1745  BP: 98/79 142/85 128/86 125/105  Pulse: 94 84  90  Temp:      TempSrc:      Resp: 20 20 30 24   SpO2: 100% 94%  100%    Wt Readings from Last 3 Encounters:  03/08/15 46.267 kg (102 lb)  12/22/14 43.545 kg (96 lb)  04/29/14 52.617 kg (116 lb)    General:  Appears anxious. She is not toxic or septic clinically. She is hemodynamically stable. Eyes: PERRL, normal lids, irises & conjunctiva ENT: grossly normal hearing, lips & tongue Neck: no LAD, masses or thyromegaly Cardiovascular: RRR, no m/r/g. No LE edema. Telemetry: SR, no arrhythmias  Respiratory: CTA bilaterally, no w/r/r. Normal respiratory effort. Abdomen: soft, nondistended abdomen at this point in time status post Foley catheter. Bowel sounds are heard and are normal. Skin: no rash or induration seen on limited exam Musculoskeletal: grossly normal tone BUE/BLE Psychiatric: Very anxious. Neurologic: grossly non-focal.          Labs on Admission:  Basic Metabolic Panel:  Recent Labs Lab 03/08/15 0216 03/10/15 1240  NA 140 135  K 3.8 4.1  CL 107 103  CO2 27 21*  GLUCOSE 94 169*  BUN 21* 48*  CREATININE 1.00 1.69*  CALCIUM 9.0 8.9   Liver Function Tests:  Recent Labs Lab 03/08/15 0216  AST 26  ALT 20  ALKPHOS 51  BILITOT 0.5  PROT 6.3*  ALBUMIN 3.6   No results for input(s): LIPASE, AMYLASE  in the last 168 hours. No results for input(s): AMMONIA in the last 168 hours. CBC:  Recent Labs Lab 03/08/15 0216 03/10/15 1240  WBC 8.4 18.4*  NEUTROABS 6.1 16.8*  HGB 12.1 11.0*  HCT 36.1 32.1*  MCV 94.3 93.0  PLT 185 184   Cardiac Enzymes: No results for input(s): CKTOTAL, CKMB, CKMBINDEX, TROPONINI in the last 168 hours.  BNP (last 3 results) No results for input(s): BNP in the last 8760 hours.  ProBNP (last 3 results) No results for input(s): PROBNP in the last 8760 hours.  CBG: No results for input(s): GLUCAP in the last 168 hours.  Radiological Exams on Admission: Ct Abdomen Pelvis Wo  Contrast  03/10/2015  CLINICAL DATA:  Bright red blood per rectum, history of recent pelvic fracture EXAM: CT ABDOMEN AND PELVIS WITHOUT CONTRAST TECHNIQUE: Multidetector CT imaging of the abdomen and pelvis was performed following the standard protocol without IV contrast. COMPARISON:  02/04/2014 FINDINGS: Lung bases are free of acute infiltrate or sizable effusion. The cardiac shadow is enlarged. A pericardial effusion is noted posteriorly as well as along the right lateral margin measuring approximately 16 mm. This is stable from the prior exam. The gallbladder is well distended and demonstrates multiple gallstones. The liver, spleen, adrenal glands and pancreas are within normal limits. In the region of the pancreatic head there is a focus of increased density identified consistent with a distal common bile duct stone. This does not appear to cause significant biliary obstruction. The kidneys are well visualized bilaterally with mild prominence of the collecting systems and ureters. This may be related to an over distended bladder as no definitive ureteral calculi are seen. Aortoiliac calcifications are seen. Again the bladder is over distended. Fecal material is noted throughout the colon. Patient's known pubic rami fractures on the left are again noted. An undisplaced left sacral fracture is  also noted. IMPRESSION: Cholelithiasis as well as choledocholithiasis. No significant biliary ductal dilatation is seen. Stable pericardial effusion. Over distended bladder contributing to fullness in the collecting systems bilaterally. Left pubic rami fractures as well as an undisplaced left sacral fracture which was not well appreciated on the prior plain film examination. Electronically Signed   By: Inez Catalina M.D.   On: 03/10/2015 17:24      Assessment/Plan   1. UTI. She appears to have quite a significant UTI. She'll be treated with intravenous antibiotics. 2. Acute renal failure. This is likely due to the outflow obstruction that appeared to be present. Insertion of Foley catheter should help this. IV fluids. Monitor renal function closely. 3. Chronic atrial fibrillation. Patient is on warfarin. I will hold this for the time being due to the hematuria. Monitor INR closely. 4. Pelvic rim fracture. Sacral fracture. I don't think these are new occurrences, especially the sacral fracture. Physical therapy evaluation for mobilization.  She'll be admitted to the medical floor. Further recommendations will depend on patient's hospital progress.  Code Status: DO NOT RESUSCITATE.  DVT Prophylaxis: SCDs.  Family Communication: I discussed the plan with the patient at the bedside.   Disposition Plan: Back to the skilled nursing facility when medically stable.   Time spent: 60 minutes.  Doree Albee [facility] Pager [phone].

## 2015-03-10 NOTE — ED Notes (Signed)
Doing rectal temp.  Pt has moderate amount of bright red blood in depends. Pt pale in color.

## 2015-03-10 NOTE — ED Notes (Signed)
[facility] assisted [facility] notified of pt's admission

## 2015-03-11 DIAGNOSIS — N3001 Acute cystitis with hematuria: Secondary | ICD-10-CM

## 2015-03-11 DIAGNOSIS — N179 Acute kidney failure, unspecified: Secondary | ICD-10-CM

## 2015-03-11 DIAGNOSIS — Z7901 Long term (current) use of anticoagulants: Secondary | ICD-10-CM

## 2015-03-11 DIAGNOSIS — S3289XD Fracture of other parts of pelvis, subsequent encounter for fracture with routine healing: Secondary | ICD-10-CM

## 2015-03-11 LAB — COMPREHENSIVE METABOLIC PANEL
ALK PHOS: 47 U/L (ref 38–126)
ALT: 17 U/L (ref 14–54)
AST: 29 U/L (ref 15–41)
Albumin: 2.8 g/dL — ABNORMAL LOW (ref 3.5–5.0)
Anion gap: 5 (ref 5–15)
BILIRUBIN TOTAL: 1.1 mg/dL (ref 0.3–1.2)
BUN: 45 mg/dL — AB (ref 6–20)
CALCIUM: 8.3 mg/dL — AB (ref 8.9–10.3)
CO2: 24 mmol/L (ref 22–32)
Chloride: 110 mmol/L (ref 101–111)
Creatinine, Ser: 1.18 mg/dL — ABNORMAL HIGH (ref 0.44–1.00)
GFR calc Af Amer: 46 mL/min — ABNORMAL LOW (ref >60)
GFR calc non Af Amer: 40 mL/min — ABNORMAL LOW (ref >60)
GLUCOSE: 102 mg/dL — AB (ref 65–99)
Potassium: 4 mmol/L (ref 3.5–5.1)
SODIUM: 139 mmol/L (ref 135–145)
TOTAL PROTEIN: 5.5 g/dL — AB (ref 6.5–8.1)

## 2015-03-11 LAB — CBC
HEMATOCRIT: 27.2 % — AB (ref 36.0–46.0)
HEMOGLOBIN: 9.1 g/dL — AB (ref 12.0–15.0)
MCH: 31.7 pg (ref 26.0–34.0)
MCHC: 33.5 g/dL (ref 30.0–36.0)
MCV: 94.8 fL (ref 78.0–100.0)
Platelets: 164 10*3/uL (ref 150–400)
RBC: 2.87 MIL/uL — ABNORMAL LOW (ref 3.87–5.11)
RDW: 13.8 % (ref 11.5–15.5)
WBC: 9.8 10*3/uL (ref 4.0–10.5)

## 2015-03-11 LAB — PROTIME-INR
INR: 4.14 — AB (ref 0.00–1.49)
Prothrombin Time: 39 seconds — ABNORMAL HIGH (ref 11.6–15.2)

## 2015-03-11 MED ORDER — PHYTONADIONE 5 MG PO TABS
5.0000 mg | ORAL_TABLET | Freq: Once | ORAL | Status: AC
  Administered 2015-03-11: 5 mg via ORAL
  Filled 2015-03-11: qty 1

## 2015-03-11 NOTE — Progress Notes (Signed)
[facility] PROGRESS NOTE  Katherine Mckinney ERD:[id number] DOB: 1925-04-24 DOA: 03/10/2015 PCP: Renata Caprice, DO  Assessment/Plan:  UTI.  - continue IV abx (rocephin) - follow up urine cx  Hematuria - likely from combination of UTI and supratherapeutic INR. - will give low dose Vit K and reassess. - Coumadin on hold.   Acute blood loss anemia. -Hgb down to 9.1 from 11 on admission due to hematuria. - Continue to monitor and transfuse if <7.  Acute renal failure. - likely a combination of pre-renal azotemia and outflow obstruction  - improving with IVF and foley placement. - continue to monitor  Chronic atrial fibrillation. - rate controlled - on warfarin as outpatient. Continue to hold given hematuria.  - monitor INR.    Pelvic rim fracture / Sacral fracture. - do not appear to be acute. - PT consulted and recommends SNF/HH PT on discharge.   Code Status: DNR Family Communication: No family at bedside. Discussed with patient who understands and has no concerns at this time. Disposition Plan: Anticipate discharge to SNF upon improvement.     Consultants:  none  Antibiotics:  Rocephin  Subjective:  States she is here because she fell. Is requesting to get dressed.   Patient is very anxious and difficult to understand.   Objective: Filed Vitals:   03/10/15 2015 03/10/15 2030 03/10/15 2145 03/11/15 0641  BP: 94/58 97/60 106/57 111/54  Pulse: 84 79 96 77  Temp:   97.5 F (36.4 C) 97.1 F (36.2 C)  TempSrc:   Oral Oral  Resp: 32 26 20 20   Weight:    46.312 kg (102 lb 1.6 oz)  SpO2: 90% 100% 100% 100%    Intake/Output Summary (Last 24 hours) at 03/11/15 0804 Last data filed at 03/11/15 0654  Gross per 24 hour  Intake      0 ml  Output   1613 ml  Net  -1613 ml   Filed Weights   03/11/15 0641  Weight: 46.312 kg (102 lb 1.6 oz)    Exam:   General:  AA Ox3  Cardiovascular: RRR  Respiratory: CTA B  Abdomen:  S/NT/ND/ +BS  Extremities:  no C/C/E  Neurologic:  Grossly intact and non-focal  Data Reviewed: Basic Metabolic Panel:  Recent Labs Lab 03/08/15 0216 03/10/15 1240 03/11/15 0501  NA 140 135 139  K 3.8 4.1 4.0  CL 107 103 110  CO2 27 21* 24  GLUCOSE 94 169* 102*  BUN 21* 48* 45*  CREATININE 1.00 1.69* 1.18*  CALCIUM 9.0 8.9 8.3*   Liver Function Tests:  Recent Labs Lab 03/08/15 0216 03/11/15 0501  AST 26 29  ALT 20 17  ALKPHOS 51 47  BILITOT 0.5 1.1  PROT 6.3* 5.5*  ALBUMIN 3.6 2.8*    CBC:  Recent Labs Lab 03/08/15 0216 03/10/15 1240 03/11/15 0501  WBC 8.4 18.4* 9.8  NEUTROABS 6.1 16.8*  --   HGB 12.1 11.0* 9.1*  HCT 36.1 32.1* 27.2*  MCV 94.3 93.0 94.8  PLT 185 184 164      Recent Results (from the past 240 hour(s))  MRSA PCR Screening     Status: None   Collection Time: 03/08/15  6:44 AM  Result Value Ref Range Status   MRSA by PCR NEGATIVE NEGATIVE Final    Comment:        The GeneXpert MRSA Assay (FDA approved for NASAL specimens only), is one component of a comprehensive MRSA colonization surveillance program. It is not intended  to diagnose MRSA infection nor to guide or monitor treatment for MRSA infections.      Studies: Ct Abdomen Pelvis Wo Contrast  03/10/2015  CLINICAL DATA:  Bright red blood per rectum, history of recent pelvic fracture EXAM: CT ABDOMEN AND PELVIS WITHOUT CONTRAST TECHNIQUE: Multidetector CT imaging of the abdomen and pelvis was performed following the standard protocol without IV contrast. COMPARISON:  02/04/2014 FINDINGS: Lung bases are free of acute infiltrate or sizable effusion. The cardiac shadow is enlarged. A pericardial effusion is noted posteriorly as well as along the right lateral margin measuring approximately 16 mm. This is stable from the prior exam. The gallbladder is well distended and demonstrates multiple gallstones. The liver, spleen, adrenal glands and pancreas are within normal limits. In the region of the pancreatic head  there is a focus of increased density identified consistent with a distal common bile duct stone. This does not appear to cause significant biliary obstruction. The kidneys are well visualized bilaterally with mild prominence of the collecting systems and ureters. This may be related to an over distended bladder as no definitive ureteral calculi are seen. Aortoiliac calcifications are seen. Again the bladder is over distended. Fecal material is noted throughout the colon. Patient's known pubic rami fractures on the left are again noted. An undisplaced left sacral fracture is also noted. IMPRESSION: Cholelithiasis as well as choledocholithiasis. No significant biliary ductal dilatation is seen. Stable pericardial effusion. Over distended bladder contributing to fullness in the collecting systems bilaterally. Left pubic rami fractures as well as an undisplaced left sacral fracture which was not well appreciated on the prior plain film examination. Electronically Signed   By: Inez Catalina M.D.   On: 03/10/2015 17:24    Scheduled Meds: . benztropine  0.5 mg Oral QHS  . beta carotene w/minerals  1 tablet Oral Daily  . cefTRIAXone (ROCEPHIN)  IV  1 g Intravenous Q24H  . levothyroxine  50 mcg Oral QAC breakfast  . metoprolol succinate  50 mg Oral Daily  . mirtazapine  15 mg Oral QHS  . pantoprazole  40 mg Oral Daily  . QUEtiapine Fumarate  150 mg Oral QHS  . senna  2 tablet Oral QHS  . sertraline  50 mg Oral Daily   Continuous Infusions: . sodium chloride 75 mL/hr at 03/11/15 0358    Active Problems:   Permanent atrial fibrillation (HCC)   Chronic anticoagulation   Pelvic rim fracture (HCC)   UTI (urinary tract infection)   Acute renal failure (ARF) (HCC)   UTI (lower urinary tract infection)   Time spent: 20 minutes   Greater than 50% of this time was spent in direct contact with the patient coordinating care.   Domingo Mend, MD [facility] Pager [phone]  If 7PM-7AM, please  contact night-coverage at [email or web address], password [facility] 03/11/2015, 8:04 AM  LOS: 1 day    By signing my name below, I, Rosalie Doctor, attest that this documentation has been prepared under the direction and in the presence of Domingo Mend, MD Electronically Signed: Rosalie Doctor, Scribe. 03/11/2015 11:40am     I have reviewed the above documentation for accuracy and completeness, and I agree with the above.  Domingo Mend, MD [facility] Pager: [phone]

## 2015-03-11 NOTE — Evaluation (Addendum)
Physical Therapy Evaluation Patient Details Name: Katherine Mckinney MRN: [id number] DOB: 1924/10/13 Today's Date: 03/11/2015   History of Present Illness  This is an 81 year old lady who was sent from the nursing facility as a possible rectal bleeding. He transpires that she probably has hematuria as there was no blood seen on rectal examination. She has had a Foley catheter inserted and there is plenty of blood seen in the urine. CT scan of the abdomen shows a distended bladder. Urinalysis is consistent with UTI. She says she has been shivering and appears to be very anxious and possibly in pain. There is been no nausea or vomiting. She is on warfarin chronically for chronic atrial fibrillation. She is now being admitted for further management  Clinical Impression  [name] is an 81 yo female who has had a recent Lt pelvic rim fracture and is now being admitted with a UTI.  She is very anxious but cooperative with therapy.  Her mobility is significantly decreased and she will need continued skilled physical therapy to advance her to her prior level of functioning which was ambulatory with a rolling walker.     Follow Up Recommendations SNF;Home health PT    Equipment Recommendations  Rolling walker with 5" wheels    Recommendations for Other Services       Precautions / Restrictions Precautions Precautions: Fall Restrictions Weight Bearing Restrictions: Yes LLE Weight Bearing: Weight bearing as tolerated      Mobility  Bed Mobility Overal bed mobility: Needs Assistance Bed Mobility: Supine to Sit;Sit to Supine     Supine to sit: Min assist Sit to supine: Mod assist      Transfers Overall transfer level: Needs assistance Equipment used: Rolling walker (2 wheeled) Transfers: Sit to/from Stand Sit to Stand: Min assist         General transfer comment: Pt is very anxious.  Sat edge of bed x 59minutes.  Sit to stand x 3 reps.  Pt to anxious to transfer safely at this time but  is very willing to work with therapy.  Ambulation/Gait                         Pertinent Vitals/Pain Pain Assessment: Faces Pain Score: 6     Home Living Family/patient expects to be discharged to:: Assisted living               Home Equipment: Walker - 2 wheels      Prior Function Level of Independence: Needs assistance   Gait / Transfers Assistance Needed: I with assistive device  ADL's / Homemaking Assistance Needed: assisted care facility           Extremity/Trunk Assessment                   LLE Deficits / Details: 2/5 for hip; 3- for knee     Communication   Communication: Other (comment) (Pt tends to talk with minimal articulation at which time you can not understand her.  If you ask her to speak louder and open her mouth she does much better. )  Cognition Arousal/Alertness: Awake/alert Behavior During Therapy: Anxious Overall Cognitive Status: Within Functional Limits for tasks assessed                      General Comments      Exercises General Exercises - Lower Extremity Ankle Circles/Pumps: Both;10 reps Quad Sets: Both;10 reps Gluteal Sets: Both;5 reps  Long Arc Quad: Both;5 reps Heel Slides: Both;5 reps      Assessment/Plan    PT Assessment Patient needs continued PT services  PT Diagnosis Difficulty walking   PT Problem List Decreased strength;Decreased activity tolerance;Decreased balance;Decreased safety awareness;Decreased mobility  PT Treatment Interventions Gait training;Functional mobility training;Therapeutic activities;Therapeutic exercise;Balance training;Patient/family education   PT Goals (Current goals can be found in the Care Plan section)      Frequency Min 5X/week   Barriers to discharge           End of Session Equipment Utilized During Treatment: Gait belt;Oxygen Activity Tolerance: Other (comment) (anxiety) Patient left: in bed;with call bell/phone within reach;with family/visitor  present Nurse Communication: Mobility status    Functional Assessment Tool Used: clinical judgement Functional Limitation: Mobility: Walking and moving around Mobility: Walking and Moving Around Current Status (S1423): At least 80 percent but less than 100 percent impaired, limited or restricted Mobility: Walking and Moving Around Goal Status [phone]): At least 80 percent but less than 100 percent impaired, limited or restricted Mobility: Walking and Moving Around Discharge Status [phone]): At least 80 percent but less than 100 percent impaired, limited or restricted    Time: 5686-1683 PT Time Calculation (min) (ACUTE ONLY): 60 min   Charges:   PT Evaluation $Initial PT Evaluation Tier I: 1 Procedure PT Treatments $Therapeutic Exercise: 8-22 mins   PT G Codes:   PT G-Codes **NOT FOR INPATIENT CLASS** Functional Assessment Tool Used: clinical judgement Functional Limitation: Mobility: Walking and moving around Mobility: Walking and Moving Around Current Status (F2902): At least 80 percent but less than 100 percent impaired, limited or restricted Mobility: Walking and Moving Around Goal Status [phone]): At least 80 percent but less than 100 percent impaired, limited or restricted Mobility: Walking and Moving Around Discharge Status [phone]): At least 80 percent but less than 100 percent impaired, limited or restricted    Rayetta Humphrey, PT CLT [phone] 03/11/2015, 11:51 AM

## 2015-03-11 NOTE — Progress Notes (Signed)
Pt arrived on floor about 11P.  Unable to obtain some information for navigators assessment due to delayed responses and no responses by pt.  All scheduled meds were taken and pt gave no indications of being in pain.  She rested with eyes closed all of shift.

## 2015-03-12 DIAGNOSIS — I482 Chronic atrial fibrillation: Secondary | ICD-10-CM

## 2015-03-12 DIAGNOSIS — N390 Urinary tract infection, site not specified: Principal | ICD-10-CM

## 2015-03-12 LAB — CBC
HCT: 24.8 % — ABNORMAL LOW (ref 36.0–46.0)
HEMOGLOBIN: 8.3 g/dL — AB (ref 12.0–15.0)
MCH: 31.7 pg (ref 26.0–34.0)
MCHC: 33.5 g/dL (ref 30.0–36.0)
MCV: 94.7 fL (ref 78.0–100.0)
Platelets: 169 10*3/uL (ref 150–400)
RBC: 2.62 MIL/uL — AB (ref 3.87–5.11)
RDW: 14.4 % (ref 11.5–15.5)
WBC: 7 10*3/uL (ref 4.0–10.5)

## 2015-03-12 LAB — BASIC METABOLIC PANEL
ANION GAP: 4 — AB (ref 5–15)
BUN: 40 mg/dL — ABNORMAL HIGH (ref 6–20)
CHLORIDE: 111 mmol/L (ref 101–111)
CO2: 25 mmol/L (ref 22–32)
Calcium: 8 mg/dL — ABNORMAL LOW (ref 8.9–10.3)
Creatinine, Ser: 0.89 mg/dL (ref 0.44–1.00)
GFR calc non Af Amer: 56 mL/min — ABNORMAL LOW (ref >60)
Glucose, Bld: 104 mg/dL — ABNORMAL HIGH (ref 65–99)
Potassium: 3.7 mmol/L (ref 3.5–5.1)
Sodium: 140 mmol/L (ref 135–145)

## 2015-03-12 NOTE — Progress Notes (Signed)
Patient asleep.

## 2015-03-12 NOTE — Progress Notes (Signed)
[facility] PROGRESS NOTE  Katherine Mckinney AVW:[id number] DOB: 03/07/1925 DOA: 03/10/2015 PCP: Renata Caprice, DO  Assessment/Plan:  UTI.  - continue IV abx (rocephin) - follow up urine cx (>100,000 proteus)  Hematuria - likely from combination of UTI and supratherapeutic INR. - still present - will recheck INR in am. - Coumadin on hold. -Discussed with daughter, we will hold coumadin for at least 2 weeks after hematuria is resolved.   Acute blood loss anemia. -Hgb down to 8.3 from 11 on admission due to hematuria. - Continue to monitor and transfuse if <7.  Acute renal failure. - likely a combination of pre-renal azotemia and outflow obstruction  - resolved with IVF and foley placement. - continue to monitor  Chronic atrial fibrillation. - rate controlled - on warfarin as outpatient. Continue to hold given hematuria.  - monitor INR.    Pelvic rim fracture / Sacral fracture. - do not appear to be acute. - PT consulted and recommends SNF/HH PT on discharge.   Code Status: DNR Family Communication: Son-in at bedside and daughter via phone updated on plan of care and all questions answered. Disposition Plan: Anticipate discharge to SNF upon improvement.     Consultants:  none  Antibiotics:  Rocephin  Subjective:  States she is here because she fell. Is requesting to get dressed.   Patient is very anxious and difficult to understand.  Per daughter this is chronic.  Objective: Filed Vitals:   03/11/15 2245 03/12/15 0646 03/12/15 0911 03/12/15 1346  BP: 115/86 118/72  99/52  Pulse: 88 72  68  Temp: 98 F (36.7 C) 98.2 F (36.8 C)  98.1 F (36.7 C)  TempSrc: Oral Oral    Resp: 20 21  20   Height:      Weight:      SpO2: 98% 100% 100% 100%    Intake/Output Summary (Last 24 hours) at 03/12/15 1351 Last data filed at 03/12/15 1350  Gross per 24 hour  Intake 1863.75 ml  Output   1450 ml  Net 413.75 ml   Filed Weights   03/11/15 0641  Weight:  46.312 kg (102 lb 1.6 oz)    Exam:   General:  AA Ox3  Cardiovascular: RRR  Respiratory: CTA B  Abdomen:  S/NT/ND/ +BS  Extremities: no C/C/E  Neurologic:  Grossly intact and non-focal  Data Reviewed: Basic Metabolic Panel:  Recent Labs Lab 03/08/15 0216 03/10/15 1240 03/11/15 0501 03/12/15 0611  NA 140 135 139 140  K 3.8 4.1 4.0 3.7  CL 107 103 110 111  CO2 27 21* 24 25  GLUCOSE 94 169* 102* 104*  BUN 21* 48* 45* 40*  CREATININE 1.00 1.69* 1.18* 0.89  CALCIUM 9.0 8.9 8.3* 8.0*   Liver Function Tests:  Recent Labs Lab 03/08/15 0216 03/11/15 0501  AST 26 29  ALT 20 17  ALKPHOS 51 47  BILITOT 0.5 1.1  PROT 6.3* 5.5*  ALBUMIN 3.6 2.8*    CBC:  Recent Labs Lab 03/08/15 0216 03/10/15 1240 03/11/15 0501 03/12/15 0611  WBC 8.4 18.4* 9.8 7.0  NEUTROABS 6.1 16.8*  --   --   HGB 12.1 11.0* 9.1* 8.3*  HCT 36.1 32.1* 27.2* 24.8*  MCV 94.3 93.0 94.8 94.7  PLT 185 184 164 169      Recent Results (from the past 240 hour(s))  MRSA PCR Screening     Status: None   Collection Time: 03/08/15  6:44 AM  Result Value Ref Range Status  MRSA by PCR NEGATIVE NEGATIVE Final    Comment:        The GeneXpert MRSA Assay (FDA approved for NASAL specimens only), is one component of a comprehensive MRSA colonization surveillance program. It is not intended to diagnose MRSA infection nor to guide or monitor treatment for MRSA infections.   Urine culture     Status: None (Preliminary result)   Collection Time: 03/10/15  3:50 PM  Result Value Ref Range Status   Specimen Description URINE, CATHETERIZED  Final   Special Requests NONE  Final   Culture   Final    >=100,000 COLONIES/mL PROTEUS MIRABILIS Performed at [facility]    Report Status PENDING  Incomplete     Studies: Ct Abdomen Pelvis Wo Contrast  03/10/2015  CLINICAL DATA:  Bright red blood per rectum, history of recent pelvic fracture EXAM: CT ABDOMEN AND PELVIS WITHOUT CONTRAST TECHNIQUE:  Multidetector CT imaging of the abdomen and pelvis was performed following the standard protocol without IV contrast. COMPARISON:  02/04/2014 FINDINGS: Lung bases are free of acute infiltrate or sizable effusion. The cardiac shadow is enlarged. A pericardial effusion is noted posteriorly as well as along the right lateral margin measuring approximately 16 mm. This is stable from the prior exam. The gallbladder is well distended and demonstrates multiple gallstones. The liver, spleen, adrenal glands and pancreas are within normal limits. In the region of the pancreatic head there is a focus of increased density identified consistent with a distal common bile duct stone. This does not appear to cause significant biliary obstruction. The kidneys are well visualized bilaterally with mild prominence of the collecting systems and ureters. This may be related to an over distended bladder as no definitive ureteral calculi are seen. Aortoiliac calcifications are seen. Again the bladder is over distended. Fecal material is noted throughout the colon. Patient's known pubic rami fractures on the left are again noted. An undisplaced left sacral fracture is also noted. IMPRESSION: Cholelithiasis as well as choledocholithiasis. No significant biliary ductal dilatation is seen. Stable pericardial effusion. Over distended bladder contributing to fullness in the collecting systems bilaterally. Left pubic rami fractures as well as an undisplaced left sacral fracture which was not well appreciated on the prior plain film examination. Electronically Signed   By: Inez Catalina M.D.   On: 03/10/2015 17:24    Scheduled Meds: . benztropine  0.5 mg Oral QHS  . beta carotene w/minerals  1 tablet Oral Daily  . cefTRIAXone (ROCEPHIN)  IV  1 g Intravenous Q24H  . levothyroxine  50 mcg Oral QAC breakfast  . metoprolol succinate  50 mg Oral Daily  . mirtazapine  15 mg Oral QHS  . pantoprazole  40 mg Oral Daily  . QUEtiapine Fumarate  150  mg Oral QHS  . senna  2 tablet Oral QHS  . sertraline  50 mg Oral Daily   Continuous Infusions: . sodium chloride 75 mL/hr at 03/12/15 3009    Active Problems:   Permanent atrial fibrillation (HCC)   Chronic anticoagulation   Pelvic rim fracture (HCC)   UTI (urinary tract infection)   Acute renal failure (ARF) (HCC)   UTI (lower urinary tract infection)   Time spent: 25 minutes   Greater than 50% of this time was spent in direct contact with the patient coordinating care.   Domingo Mend, MD [facility] Pager [phone]  If 7PM-7AM, please contact night-coverage at [email or web address], password [facility] 03/12/2015, 1:51 PM  LOS: 2 days

## 2015-03-13 LAB — URINE CULTURE: Culture: >=100000

## 2015-03-13 LAB — CBC
HEMATOCRIT: 23.8 % — AB (ref 36.0–46.0)
HEMOGLOBIN: 8 g/dL — AB (ref 12.0–15.0)
MCH: 31.9 pg (ref 26.0–34.0)
MCHC: 33.6 g/dL (ref 30.0–36.0)
MCV: 94.8 fL (ref 78.0–100.0)
PLATELETS: 169 10*3/uL (ref 150–400)
RBC: 2.51 MIL/uL — AB (ref 3.87–5.11)
RDW: 14.1 % (ref 11.5–15.5)
WBC: 5.1 10*3/uL (ref 4.0–10.5)

## 2015-03-13 LAB — PROTIME-INR
INR: 1.4 (ref 0.00–1.49)
Prothrombin Time: 17.2 seconds — ABNORMAL HIGH (ref 11.6–15.2)

## 2015-03-13 LAB — BASIC METABOLIC PANEL
ANION GAP: 3 — AB (ref 5–15)
BUN: 27 mg/dL — ABNORMAL HIGH (ref 6–20)
CHLORIDE: 113 mmol/L — AB (ref 101–111)
CO2: 24 mmol/L (ref 22–32)
Calcium: 7.8 mg/dL — ABNORMAL LOW (ref 8.9–10.3)
Creatinine, Ser: 0.73 mg/dL (ref 0.44–1.00)
GFR calc Af Amer: >60 mL/min (ref >60)
GFR calc non Af Amer: >60 mL/min (ref >60)
Glucose, Bld: 111 mg/dL — ABNORMAL HIGH (ref 65–99)
Potassium: 3.6 mmol/L (ref 3.5–5.1)
Sodium: 140 mmol/L (ref 135–145)

## 2015-03-13 MED ORDER — CIPROFLOXACIN HCL 250 MG PO TABS: 250.0000 mg | ORAL_TABLET | Freq: Two times a day (BID) | ORAL | Status: DC

## 2015-03-13 NOTE — Progress Notes (Signed)
Physical Therapy Treatment Patient Details Name: Katherine Mckinney MRN: [id number] DOB: Jun 19, 1924 Today's Date: 03/13/2015    History of Present Illness This is an 81 year old lady who was sent from the nursing facility as a possible rectal bleeding. He transpires that she probably has hematuria as there was no blood seen on rectal examination. She has had a Foley catheter inserted and there is plenty of blood seen in the urine. CT scan of the abdomen shows a distended bladder. Urinalysis is consistent with UTI. She says she has been shivering and appears to be very anxious and possibly in pain. There is been no nausea or vomiting. She is on warfarin chronically for chronic atrial fibrillation. She is now being admitted for further management    PT Comments    Pt sleeping calmly until I entered the room and then she began to hyperventilate and whine about a myriad of things.  She was oriented and cooperative and able to tolerate gentle bed exercise for LE ROM and strengthening.  She was able to transfer to EOB with min assist and stand to a walker with min assist.  She has no confidence about her ability to stand even when she is standing.  Min assist needed to stand and pivot to a chair.  Her rehab potential will be limited by her anxiety level.  Hopefully as her pain decreases she will become less anxious.    Follow Up Recommendations  SNF;Home health PT (per family preference)     Equipment Recommendations       Recommendations for Other Services  none     Precautions / Restrictions Precautions Precautions: Fall Restrictions LLE Weight Bearing: Weight bearing as tolerated    Mobility  Bed Mobility Overal bed mobility: Needs Assistance Bed Mobility: Supine to Sit     Supine to sit: Min assist     General bed mobility comments: pt able to scoot laterally to [facility]  with min  assist  Transfers Overall transfer level: Needs assistance Equipment used: Rolling walker (2  wheeled) Transfers: Sit to/from Stand Sit to Stand: Min assist Stand pivot transfers: Min assist       General transfer comment: sit to stand x 2 with facilitation of center of gravity over base of support.Marland KitchenMarland Kitchenpt whines that she "can't do it" while she is up standing.  Ambulation/Gait             General Gait Details: unable to ambulate   Stairs            Wheelchair Mobility    Modified Rankin (Stroke Patients Only)       Balance                                    Cognition Arousal/Alertness: Awake/alert Behavior During Therapy: Anxious Overall Cognitive Status: Within Functional Limits for tasks assessed                      Exercises General Exercises - Lower Extremity Ankle Circles/Pumps: Both;10 reps;Supine;AROM Quad Sets: Both;10 reps;Supine;AROM Gluteal Sets: Both;10 reps Heel Slides: AAROM;Both;10 reps;Supine Hip ABduction/ADduction: AAROM;Both;10 reps;Supine    General Comments        Pertinent Vitals/Pain Pain Assessment: No/denies pain (at rest)    Home Living                      Prior Function  PT Goals (current goals can now be found in the care plan section) Progress towards PT goals: Progressing toward goals    Frequency  Min 5X/week    PT Plan Current plan remains appropriate    Co-evaluation             End of Session Equipment Utilized During Treatment: Gait belt;Oxygen Activity Tolerance: Other (comment) (limited by anxiety) Patient left: in chair;with call bell/phone within reach;with chair alarm set;with nursing/sitter in room     Time: 1000-1035 PT Time Calculation (min) (ACUTE ONLY): 35 min  Charges:  $Therapeutic Exercise: 8-22 mins $Therapeutic Activity: 8-22 mins                    G CodesSable Feil  PT 03/13/2015, 10:47 AM [phone]

## 2015-03-13 NOTE — Discharge Planning (Signed)
Pt IV and foley removed.  DC paper given, explained and educated.  Told of suggested FU appts and also given script.  VSS and RN assessment revealed stability for DC back to [facility]F.  Packet given to family - to be taken to facility.  Pt will be wheeled to front and family will be transporting to facility via car.

## 2015-03-13 NOTE — Discharge Summary (Signed)
Physician Discharge Summary  Katherine Mckinney IEP:[id number] DOB: 12-22-1924 DOA: 03/10/2015  PCP: Katherine Caprice, DO  Admit date: 03/10/2015 Discharge date: 03/13/2015  Time spent: 45 minutes  Recommendations for Outpatient Follow-up:  -Will be discharged back to ALF today. -Would recommend recheck of Hb in 3 days and consideration of transfusion for Hb <7.0. -Recommendation has been given to hold coumadin for next 2 weeks to allow clearing of hematuria. Once coumadin has been restarted will need close monitoring of INR to ensure it does not become supratherapeutic.   Discharge Diagnoses:  Active Problems:   Permanent atrial fibrillation (HCC)   Chronic anticoagulation   Pelvic rim fracture (HCC)   UTI (urinary tract infection)   Acute renal failure (ARF) (HCC)   UTI (lower urinary tract infection)   Discharge Condition: Stable and improved  Filed Weights   03/11/15 0641  Weight: 46.312 kg (102 lb 1.6 oz)    History of present illness:  As per Dr. Anastasio Mckinney 10/21:  This is an 81 year old lady who was sent from the nursing facility as a possible rectal bleeding. He transpires that she probably has hematuria as there was no blood seen on rectal examination. She has had a Foley catheter inserted and there is plenty of blood seen in the urine. CT scan of the abdomen shows a distended bladder. Urinalysis is consistent with UTI. She says she has been shivering and appears to be very anxious and possibly in pain. There is been no nausea or vomiting. She is on warfarin chronically for chronic atrial fibrillation. She is now being admitted for further management.  Hospital Course:   UTI.  - urine cx with pansensitive proteus. - will dc on a 7 day course of levaquin.  Hematuria - likely from combination of UTI and supratherapeutic INR. - will dc foley catheter prior to dc. - plan to hold coumadin for next 2 weeks. - if hematuria persists, will need follow up with urology.  Acute  blood loss anemia. -Hgb down to 8.5 from 11 on admission due to hematuria. - no indication for acute transfusion. - would recommend her Hb be monitored in 3 days and consideration be given to transfusion if hb is <7.0.  Acute renal failure. - likely a combination of pre-renal azotemia and outflow obstruction  - resolved  Chronic atrial fibrillation. - rate controlled - on warfarin as outpatient. Continue to hold given hematuria.  - resume coumadin in 2 weeks as long as hematuria as cleared and monitor INR closely (this plan has been discussed with patient's daughter Katherine Mckinney).  Pelvic rim fracture / Sacral fracture. - do not appear to be acute. - PT consulted and recommends HH PT at ALF upon DC.  Procedures:  None   Consultations:  None  Discharge Instructions  Discharge Instructions    Foley catheter - discontinue    Complete by:  As directed      Increase activity slowly    Complete by:  As directed             Medication List    STOP taking these medications        warfarin 4 MG tablet  Commonly known as:  COUMADIN      TAKE these medications        acetaminophen 325 MG tablet  Commonly known as:  TYLENOL  Take 650 mg by mouth every 6 (six) hours as needed for mild pain or moderate pain.     [place]  Apply 1 application topically 2 (two) times daily as needed.     benztropine 0.5 MG tablet  Commonly known as:  COGENTIN  Take 0.5 mg by mouth at bedtime.     ciprofloxacin 250 MG tablet  Commonly known as:  CIPRO  Take 1 tablet (250 mg total) by mouth 2 (two) times daily.     I-VITE PO  Take 1 tablet by mouth daily.     levothyroxine 50 MCG tablet  Commonly known as:  SYNTHROID, LEVOTHROID  Take 50 mcg by mouth daily.     LORazepam 0.5 MG tablet  Commonly known as:  ATIVAN  Take 0.25 mg by mouth every 4 (four) hours as needed for anxiety.     metoprolol succinate 50 MG 24 hr tablet  Commonly known as:  TOPROL-XL  Take 50 mg by mouth  daily.     mirtazapine 15 MG tablet  Commonly known as:  REMERON  Take 15 mg by mouth at bedtime.     pantoprazole 40 MG tablet  Commonly known as:  PROTONIX  Take 40 mg by mouth daily.     senna 8.6 MG Tabs tablet  Commonly known as:  SENOKOT  Take 2 tablets by mouth at bedtime.     SEROQUEL XR 150 MG 24 hr tablet  Generic drug:  QUEtiapine Fumarate  Take 150 mg by mouth at bedtime.     sertraline 50 MG tablet  Commonly known as:  ZOLOFT  Take 50 mg by mouth daily.     traMADol 50 MG tablet  Commonly known as:  ULTRAM  Take 1 tablet (50 mg total) by mouth every 6 (six) hours as needed for moderate pain.       Allergies  Allergen Reactions  . Codeine Nausea And Vomiting       Follow-up Information    Follow up with Katherine Caprice, DO. Schedule an appointment as soon as possible for a visit in 3 days.   Specialty:  Family Medicine   Contact information:   [place] [address] [phone]        The results of significant diagnostics from this hospitalization (including imaging, microbiology, ancillary and laboratory) are listed below for reference.    Significant Diagnostic Studies: Ct Abdomen Pelvis Wo Contrast  03/10/2015  CLINICAL DATA:  Bright red blood per rectum, history of recent pelvic fracture EXAM: CT ABDOMEN AND PELVIS WITHOUT CONTRAST TECHNIQUE: Multidetector CT imaging of the abdomen and pelvis was performed following the standard protocol without IV contrast. COMPARISON:  02/04/2014 FINDINGS: Lung bases are free of acute infiltrate or sizable effusion. The cardiac shadow is enlarged. A pericardial effusion is noted posteriorly as well as along the right lateral margin measuring approximately 16 mm. This is stable from the prior exam. The gallbladder is well distended and demonstrates multiple gallstones. The liver, spleen, adrenal glands and pancreas are within normal limits. In the region of the pancreatic head there is a focus of  increased density identified consistent with a distal common bile duct stone. This does not appear to cause significant biliary obstruction. The kidneys are well visualized bilaterally with mild prominence of the collecting systems and ureters. This may be related to an over distended bladder as no definitive ureteral calculi are seen. Aortoiliac calcifications are seen. Again the bladder is over distended. Fecal material is noted throughout the colon. Patient's known pubic rami fractures on the left are again noted. An undisplaced left sacral fracture is also noted. IMPRESSION:  Cholelithiasis as well as choledocholithiasis. No significant biliary ductal dilatation is seen. Stable pericardial effusion. Over distended bladder contributing to fullness in the collecting systems bilaterally. Left pubic rami fractures as well as an undisplaced left sacral fracture which was not well appreciated on the prior plain film examination. Electronically Signed   By: Inez Catalina M.D.   On: 03/10/2015 17:24   Dg Hip Unilat With Pelvis 2-3 Views Left  03/08/2015  CLINICAL DATA:  Fall at assisted living facility tonight while walking back from bathroom. Now with left groin pain. EXAM: DG HIP (WITH OR WITHOUT PELVIS) 2-3V LEFT COMPARISON:  None. FINDINGS: There are displaced fractures of the left superior and inferior pubic rami without acetabular extension. The left hip joint remains congruent, left femoral head is normally located. No additional acute fracture. Degenerative change of both hips, right greater than left. The bones appear under mineralized. Vascular calcifications are seen. IMPRESSION: Displaced fractures of the left superior and inferior pubic rami. No evidence of acetabular involvement. Electronically Signed   By: Jeb Levering M.D.   On: 03/08/2015 01:36    Microbiology: Recent Results (from the past 240 hour(s))  MRSA PCR Screening     Status: None   Collection Time: 03/08/15  6:44 AM  Result Value  Ref Range Status   MRSA by PCR NEGATIVE NEGATIVE Final    Comment:        The GeneXpert MRSA Assay (FDA approved for NASAL specimens only), is one component of a comprehensive MRSA colonization surveillance program. It is not intended to diagnose MRSA infection nor to guide or monitor treatment for MRSA infections.   Urine culture     Status: None   Collection Time: 03/10/15  3:50 PM  Result Value Ref Range Status   Specimen Description URINE, CATHETERIZED  Final   Special Requests NONE  Final   Culture   Final    >=100,000 COLONIES/mL PROTEUS MIRABILIS Performed at [facility]    Report Status 03/13/2015 FINAL  Final   Organism ID, Bacteria PROTEUS MIRABILIS  Final      Susceptibility   Proteus mirabilis - MIC*    AMPICILLIN <=2 SENSITIVE Sensitive     CEFAZOLIN <=4 SENSITIVE Sensitive     CEFTRIAXONE <=1 SENSITIVE Sensitive     CIPROFLOXACIN <=0.25 SENSITIVE Sensitive     GENTAMICIN <=1 SENSITIVE Sensitive     IMIPENEM 1 SENSITIVE Sensitive     NITROFURANTOIN 128 RESISTANT Resistant     TRIMETH/SULFA <=20 SENSITIVE Sensitive     AMPICILLIN/SULBACTAM <=2 SENSITIVE Sensitive     PIP/TAZO <=4 SENSITIVE Sensitive     * >=100,000 COLONIES/mL PROTEUS MIRABILIS     Labs: Basic Metabolic Panel:  Recent Labs Lab 03/08/15 0216 03/10/15 1240 03/11/15 0501 03/12/15 0611 03/13/15 0617  NA 140 135 139 140 140  K 3.8 4.1 4.0 3.7 3.6  CL 107 103 110 111 113*  CO2 27 21* 24 25 24   GLUCOSE 94 169* 102* 104* 111*  BUN 21* 48* 45* 40* 27*  CREATININE 1.00 1.69* 1.18* 0.89 0.73  CALCIUM 9.0 8.9 8.3* 8.0* 7.8*   Liver Function Tests:  Recent Labs Lab 03/08/15 0216 03/11/15 0501  AST 26 29  ALT 20 17  ALKPHOS 51 47  BILITOT 0.5 1.1  PROT 6.3* 5.5*  ALBUMIN 3.6 2.8*   No results for input(s): LIPASE, AMYLASE in the last 168 hours. No results for input(s): AMMONIA in the last 168 hours. CBC:  Recent Labs Lab 03/08/15 0216 03/10/15  1240 03/11/15 0501  03/12/15 0611 03/13/15 0617  WBC 8.4 18.4* 9.8 7.0 5.1  NEUTROABS 6.1 16.8*  --   --   --   HGB 12.1 11.0* 9.1* 8.3* 8.0*  HCT 36.1 32.1* 27.2* 24.8* 23.8*  MCV 94.3 93.0 94.8 94.7 94.8  PLT 185 184 164 169 169   Cardiac Enzymes: No results for input(s): CKTOTAL, CKMB, CKMBINDEX, TROPONINI in the last 168 hours. BNP: BNP (last 3 results) No results for input(s): BNP in the last 8760 hours.  ProBNP (last 3 results) No results for input(s): PROBNP in the last 8760 hours.  CBG: No results for input(s): GLUCAP in the last 168 hours.     SignedLelon Frohlich  [facility] Pager: [phone] 03/13/2015, 2:02 PM

## 2015-03-13 NOTE — Care Management Important Message (Signed)
Important Message  Patient Details  Name: Katherine Mckinney MRN: [id number] Date of Birth: [birth year]   Medicare Important Message Given:  Yes-second notification given    Joylene Draft, RN 03/13/2015, 1:40 PM

## 2015-03-13 NOTE — Care Management Note (Signed)
Case Management Note  Patient Details  Name: Katherine Mckinney MRN: [id number] Date of Birth: 06/22/1924  Subjective/Objective:                  Pt admitted from [facility] with hematuria. Pt requires max assistance with ADL's and transfers.  Action/Plan: PT is recommending SNF at discharge. CSW is aware and will discuss with [place] and pt/family. Will continue to follow for discharge planning needs.  Expected Discharge Date:                  Expected Discharge Plan:  Skilled Nursing Facility  In-House Referral:  Clinical Social Work  Discharge planning Services  CM Consult  Post Acute Care Choice:  NA Choice offered to:  NA  DME Arranged:    DME Agency:     HH Arranged:    [place] Agency:     Status of Service:  Completed, signed off  Medicare Important Message Given:    Date Medicare IM Given:    Medicare IM give by:    Date Additional Medicare IM Given:    Additional Medicare Important Message give by:     If discussed at [place] of Stay Meetings, dates discussed:    Additional Comments:  Joylene Draft, RN 03/13/2015, 10:21 AM

## 2015-03-13 NOTE — Care Management Note (Signed)
Case Management Note  Patient Details  Name: FONTELLA SHAN MRN: [id number] Date of Birth: 1925/03/31  Subjective/Objective:                  Pt admitted from [facility] with UTI/hematuria. Anticipate pt will discharge back to facility at discharge. Pt has walker for use at facility.  Action/Plan: Pt for discharge to [facility] today. CSW to arrange discharge to facility.  Expected Discharge Date:                  Expected Discharge Plan:  Assisted Living / Rest Home  In-House Referral:  Clinical Social Work  Discharge planning Services  CM Consult  Post Acute Care Choice:  NA Choice offered to:  NA  DME Arranged:    DME Agency:     HH Arranged:    [place] Agency:     Status of Service:  Completed, signed off  Medicare Important Message Given:    Date Medicare IM Given:    Medicare IM give by:    Date Additional Medicare IM Given:    Additional Medicare Important Message give by:     If discussed at [place] of Stay Meetings, dates discussed:    Additional Comments:  Joylene Draft, RN 03/13/2015, 1:39 PM
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
## 2015-03-15 ENCOUNTER — Telehealth: Payer: Self-pay | Admitting: *Deleted

## 2015-03-15 NOTE — Telephone Encounter (Signed)
Please call Katherine Mckinney at [facility] [phone] in regards to [name] coumdin

## 2015-03-15 NOTE — Telephone Encounter (Signed)
Levada Dy called ti inform me coumadin had been discontinued in the hospital due to hematuria.  Reviewed hospital D/C summary.  INR was 1.9 day before admission.  Pt became supratherapeutic in the hospital.  At discharge orders were to hold coumadin x 2 and restart if urine was clear at that time.  If hematuria still present pt is to see Urologist.   Morene Rankins at [facility] to have facility MD follow urine and HGB and let me know when pt is started back on coumadin.  She verbalized understanding.

## 2015-03-20 ENCOUNTER — Other Ambulatory Visit (HOSPITAL_COMMUNITY)
Admission: RE | Admit: 2015-03-20 | Discharge: 2015-03-20 | Disposition: A | Discharge status: Home / Self Care | Payer: Medicare Other | Source: Other Acute Inpatient Hospital | Attending: Family Medicine | Admitting: Family Medicine

## 2015-03-20 DIAGNOSIS — I482 Chronic atrial fibrillation: Secondary | ICD-10-CM | POA: Diagnosis present

## 2015-03-20 LAB — PROTIME-INR
INR: 1.3 (ref 0.00–1.49)
Prothrombin Time: 16.3 seconds — ABNORMAL HIGH (ref 11.6–15.2)

## 2015-03-23 ENCOUNTER — Other Ambulatory Visit (HOSPITAL_COMMUNITY)
Admission: RE | Admit: 2015-03-23 | Discharge: 2015-03-23 | Disposition: A | Discharge status: Home / Self Care | Payer: Medicare Other | Source: Other Acute Inpatient Hospital | Attending: Family Medicine | Admitting: Family Medicine

## 2015-03-23 DIAGNOSIS — Z7901 Long term (current) use of anticoagulants: Secondary | ICD-10-CM | POA: Diagnosis present

## 2015-03-23 LAB — PROTIME-INR
INR: 1.25 (ref 0.00–1.49)
PROTHROMBIN TIME: 15.9 s — AB (ref 11.6–15.2)

## 2015-03-27 ENCOUNTER — Other Ambulatory Visit (HOSPITAL_COMMUNITY)
Admission: AD | Admit: 2015-03-27 | Discharge: 2015-03-27 | Disposition: A | Discharge status: Home / Self Care | Payer: Medicare Other | Source: Other Acute Inpatient Hospital | Attending: Family Medicine | Admitting: Family Medicine

## 2015-03-27 DIAGNOSIS — C131 Malignant neoplasm of aryepiglottic fold, hypopharyngeal aspect: Secondary | ICD-10-CM | POA: Diagnosis present

## 2015-03-27 DIAGNOSIS — Z7901 Long term (current) use of anticoagulants: Secondary | ICD-10-CM | POA: Insufficient documentation

## 2015-03-27 LAB — PROTIME-INR
INR: 1.43 (ref 0.00–1.49)
Prothrombin Time: 17.6 seconds — ABNORMAL HIGH (ref 11.6–15.2)

## 2015-03-31 ENCOUNTER — Other Ambulatory Visit (HOSPITAL_COMMUNITY)
Admission: RE | Admit: 2015-03-31 | Discharge: 2015-03-31 | Disposition: A | Discharge status: Home / Self Care | Payer: Medicare Other | Source: Skilled Nursing Facility | Attending: Family Medicine | Admitting: Family Medicine

## 2015-03-31 DIAGNOSIS — I482 Chronic atrial fibrillation: Secondary | ICD-10-CM | POA: Insufficient documentation

## 2015-03-31 LAB — PROTIME-INR
INR: 1.99 — ABNORMAL HIGH (ref 0.00–1.49)
Prothrombin Time: 22.5 seconds — ABNORMAL HIGH (ref 11.6–15.2)

## 2015-04-07 ENCOUNTER — Other Ambulatory Visit (HOSPITAL_COMMUNITY)
Admission: RE | Admit: 2015-04-07 | Discharge: 2015-04-07 | Disposition: A | Discharge status: Home / Self Care | Payer: Medicare Other | Source: Other Acute Inpatient Hospital | Attending: Family Medicine | Admitting: Family Medicine

## 2015-04-07 DIAGNOSIS — C131 Malignant neoplasm of aryepiglottic fold, hypopharyngeal aspect: Secondary | ICD-10-CM | POA: Diagnosis present

## 2015-04-07 DIAGNOSIS — D802 Selective deficiency of immunoglobulin A [IgA]: Secondary | ICD-10-CM | POA: Diagnosis present

## 2015-04-07 DIAGNOSIS — Z7901 Long term (current) use of anticoagulants: Secondary | ICD-10-CM | POA: Diagnosis present

## 2015-04-07 LAB — PROTIME-INR
INR: 3 — AB (ref 0.00–1.49)
Prothrombin Time: 30.6 seconds — ABNORMAL HIGH (ref 11.6–15.2)

## 2015-04-17 ENCOUNTER — Other Ambulatory Visit (HOSPITAL_COMMUNITY)
Admission: RE | Admit: 2015-04-17 | Discharge: 2015-04-17 | Disposition: A | Discharge status: Home / Self Care | Payer: Medicare Other | Source: Other Acute Inpatient Hospital | Attending: Family Medicine | Admitting: Family Medicine

## 2015-04-17 DIAGNOSIS — Z7901 Long term (current) use of anticoagulants: Secondary | ICD-10-CM | POA: Insufficient documentation

## 2015-04-17 DIAGNOSIS — I482 Chronic atrial fibrillation: Secondary | ICD-10-CM | POA: Insufficient documentation

## 2015-04-17 LAB — PROTIME-INR
INR: 2.89 — AB (ref 0.00–1.49)
Prothrombin Time: 29.7 seconds — ABNORMAL HIGH (ref 11.6–15.2)

## 2015-04-24 ENCOUNTER — Encounter (HOSPITAL_COMMUNITY): Payer: Self-pay | Admitting: Emergency Medicine

## 2015-04-24 ENCOUNTER — Emergency Department (HOSPITAL_COMMUNITY): Payer: Medicare Other

## 2015-04-24 ENCOUNTER — Emergency Department (HOSPITAL_COMMUNITY)
Admission: EM | Admit: 2015-04-24 | Discharge: 2015-04-24 | Disposition: A | Discharge status: Skilled Nursing Facility | Payer: Medicare Other | Attending: Emergency Medicine | Admitting: Emergency Medicine

## 2015-04-24 DIAGNOSIS — K219 Gastro-esophageal reflux disease without esophagitis: Secondary | ICD-10-CM | POA: Insufficient documentation

## 2015-04-24 DIAGNOSIS — F418 Other specified anxiety disorders: Secondary | ICD-10-CM | POA: Insufficient documentation

## 2015-04-24 DIAGNOSIS — Z79899 Other long term (current) drug therapy: Secondary | ICD-10-CM | POA: Diagnosis not present

## 2015-04-24 DIAGNOSIS — E039 Hypothyroidism, unspecified: Secondary | ICD-10-CM | POA: Insufficient documentation

## 2015-04-24 DIAGNOSIS — I482 Chronic atrial fibrillation: Secondary | ICD-10-CM | POA: Insufficient documentation

## 2015-04-24 DIAGNOSIS — Y9389 Activity, other specified: Secondary | ICD-10-CM | POA: Diagnosis not present

## 2015-04-24 DIAGNOSIS — S7002XA Contusion of left hip, initial encounter: Secondary | ICD-10-CM | POA: Diagnosis not present

## 2015-04-24 DIAGNOSIS — Y9289 Other specified places as the place of occurrence of the external cause: Secondary | ICD-10-CM | POA: Diagnosis not present

## 2015-04-24 DIAGNOSIS — R011 Cardiac murmur, unspecified: Secondary | ICD-10-CM | POA: Insufficient documentation

## 2015-04-24 DIAGNOSIS — I2510 Atherosclerotic heart disease of native coronary artery without angina pectoris: Secondary | ICD-10-CM | POA: Insufficient documentation

## 2015-04-24 DIAGNOSIS — S0003XA Contusion of scalp, initial encounter: Secondary | ICD-10-CM | POA: Diagnosis not present

## 2015-04-24 DIAGNOSIS — S0990XA Unspecified injury of head, initial encounter: Secondary | ICD-10-CM | POA: Insufficient documentation

## 2015-04-24 DIAGNOSIS — S79912A Unspecified injury of left hip, initial encounter: Secondary | ICD-10-CM | POA: Diagnosis present

## 2015-04-24 DIAGNOSIS — Y998 Other external cause status: Secondary | ICD-10-CM | POA: Insufficient documentation

## 2015-04-24 DIAGNOSIS — Z8541 Personal history of malignant neoplasm of cervix uteri: Secondary | ICD-10-CM | POA: Insufficient documentation

## 2015-04-24 DIAGNOSIS — Z792 Long term (current) use of antibiotics: Secondary | ICD-10-CM | POA: Diagnosis not present

## 2015-04-24 DIAGNOSIS — W01198A Fall on same level from slipping, tripping and stumbling with subsequent striking against other object, initial encounter: Secondary | ICD-10-CM | POA: Insufficient documentation

## 2015-04-24 LAB — COMPREHENSIVE METABOLIC PANEL
ALBUMIN: 3.3 g/dL — AB (ref 3.5–5.0)
ALT: 14 U/L (ref 14–54)
ANION GAP: 5 (ref 5–15)
AST: 22 U/L (ref 15–41)
Alkaline Phosphatase: 162 U/L — ABNORMAL HIGH (ref 38–126)
BILIRUBIN TOTAL: 0.5 mg/dL (ref 0.3–1.2)
BUN: 23 mg/dL — ABNORMAL HIGH (ref 6–20)
CO2: 26 mmol/L (ref 22–32)
Calcium: 8.9 mg/dL (ref 8.9–10.3)
Chloride: 108 mmol/L (ref 101–111)
Creatinine, Ser: 0.86 mg/dL (ref 0.44–1.00)
GFR calc Af Amer: >60 mL/min (ref >60)
GFR calc non Af Amer: 59 mL/min — ABNORMAL LOW (ref >60)
GLUCOSE: 96 mg/dL (ref 65–99)
POTASSIUM: 4.1 mmol/L (ref 3.5–5.1)
SODIUM: 139 mmol/L (ref 135–145)
TOTAL PROTEIN: 6.3 g/dL — AB (ref 6.5–8.1)

## 2015-04-24 LAB — PROTIME-INR
INR: 3.46 — ABNORMAL HIGH (ref 0.00–1.49)
PROTHROMBIN TIME: 34.1 s — AB (ref 11.6–15.2)

## 2015-04-24 LAB — CBC WITH DIFFERENTIAL/PLATELET
Basophils Absolute: 0 10*3/uL (ref 0.0–0.1)
Basophils Relative: 1 %
Eosinophils Absolute: 0 10*3/uL (ref 0.0–0.7)
Eosinophils Relative: 1 %
HEMATOCRIT: 37.7 % (ref 36.0–46.0)
HEMOGLOBIN: 12.5 g/dL (ref 12.0–15.0)
Lymphocytes Relative: 28 %
Lymphs Abs: 1.7 10*3/uL (ref 0.7–4.0)
MCH: 31.1 pg (ref 26.0–34.0)
MCHC: 33.2 g/dL (ref 30.0–36.0)
MCV: 93.8 fL (ref 78.0–100.0)
Monocytes Absolute: 0.5 10*3/uL (ref 0.1–1.0)
Monocytes Relative: 8 %
NEUTROS ABS: 3.8 10*3/uL (ref 1.7–7.7)
NEUTROS PCT: 62 %
Platelets: 229 10*3/uL (ref 150–400)
RBC: 4.02 MIL/uL (ref 3.87–5.11)
RDW: 14.4 % (ref 11.5–15.5)
WBC: 6.1 10*3/uL (ref 4.0–10.5)

## 2015-04-24 LAB — URINALYSIS, ROUTINE W REFLEX MICROSCOPIC
GLUCOSE, UA: NEGATIVE mg/dL
HGB URINE DIPSTICK: NEGATIVE
Nitrite: NEGATIVE
PH: 7 (ref 5.0–8.0)
SPECIFIC GRAVITY, URINE: 1.01 (ref 1.005–1.030)

## 2015-04-24 LAB — URINE MICROSCOPIC-ADD ON: RBC / HPF: NONE SEEN RBC/hpf (ref 0–5)

## 2015-04-24 LAB — TROPONIN I: Troponin I: <0.03 ng/mL (ref <0.031)

## 2015-04-24 MED ORDER — CEPHALEXIN 500 MG PO CAPS
500.0000 mg | ORAL_CAPSULE | Freq: Once | ORAL | Status: AC
  Administered 2015-04-24: 500 mg via ORAL
  Filled 2015-04-24: qty 1

## 2015-04-24 MED ORDER — CEPHALEXIN 500 MG PO CAPS: 500.0000 mg | ORAL_CAPSULE | Freq: Four times a day (QID) | ORAL | Status: AC

## 2015-04-24 NOTE — ED Notes (Signed)
Standing position at [facility].  Fell backwards and landed on buttock.  C/o left hip pain and pt says she hit her head.  Pt did ambulate to stretcher with help. Pt say hip hurts when she moves.

## 2015-04-24 NOTE — ED Notes (Signed)
Pt made aware to return if symptoms worsen or if any life threatening symptoms occur.   

## 2015-04-24 NOTE — ED Provider Notes (Signed)
CSN: [id number]     Arrival date & time 04/24/15  [id number] History   First MD Initiated Contact with Patient 04/24/15 [phone]     Chief Complaint  Patient presents with  . Fall     (Consider location/radiation/quality/duration/timing/severity/associated sxs/prior Treatment) HPI Comments: Patient is an 81 year old female who presents to the emergency department following a fall. Patient states she was preparing for breakfast time. She had some dizziness when she stood up, and she states that she fell, hit her head, and injured her back and left hip. It is of note that the patient had pubic ramus fracture on the left in the past. It is of note that the patient was ambulatory to the stretcher when EMS arrived.. She complains of pain with movement, but otherwise no significant pain involving the left hip or pelvis. It is also of note that the patient is on Coumadin.  Patient is a 81 y.o. female presenting with fall. The history is provided by the patient.  Fall This is a new problem. The current episode started today. The problem has been unchanged. Associated symptoms include arthralgias and headaches. Pertinent negatives include no chest pain, chills, visual change or vomiting. Nothing aggravates the symptoms. She has tried nothing for the symptoms. The treatment provided no relief.    Past Medical History  Diagnosis Date  . Chronic atrial fibrillation ([facility])   . Hyperlipemia   . Coronary atherosclerosis of native coronary artery     Minimal at cardiac catheterization in 04/1998  . Uterine carcinoma ([facility])     TAH/BSO in 07/2000  . Glaucoma   . Depression with anxiety   . Hypothyroidism   . GERD (gastroesophageal reflux disease)     Esophageal dilatation for stricture 2013   Past Surgical History  Procedure Laterality Date  . Tonsillectomy    . Appendectomy    . Breast excisional biopsy  1997    Right  . Total abdominal hysterectomy w/ bilateral salpingoophorectomy  07/2000    Neoplastic  disease  . Colonoscopy  01/23/07    Diminutive rectal polyp/scattered sigmoid diverticula/tubular adenoma. Due 01/2012  . Esophagogastroduodenoscopy  08/14/2011    Dr. Gala Romney: mild erosive reflux esophagitis, probable subtle ring at GE junction s/p Maloney dilation. small hiatal hernia  . Maloney dilation  08/14/2011    Procedure: Venia Minks DILATION;  Surgeon: Daneil Dolin, MD;  Location: [facility];  Service: Endoscopy;  Laterality: N/A;   Family History  Problem Relation Age of Onset  . Heart attack Mother     H/o CAD in 54 brothers as well  . Lung cancer Father     Pneumoconiosis  . Coronary artery disease Brother     +3 other brothers, 2 with acute MI  . Colon cancer Neg Hx    Social History  Substance Use Topics  . Smoking status: Never Smoker   . Smokeless tobacco: Never Used     Comment: Never smoked  . Alcohol Use: No   OB History    No data available     Review of Systems  Constitutional: Negative for chills.  Cardiovascular: Negative for chest pain.  Gastrointestinal: Negative for vomiting.  Musculoskeletal: Positive for arthralgias.  Neurological: Positive for headaches.      Allergies  Codeine  Home Medications   Prior to Admission medications   Medication Sig Start Date End Date Taking? Authorizing Provider  acetaminophen (TYLENOL) 325 MG tablet Take 650 mg by mouth every 6 (six) hours as needed for mild pain  or moderate pain.    Historical Provider, MD  benztropine (COGENTIN) 0.5 MG tablet Take 0.5 mg by mouth at bedtime.    Historical Provider, MD  ciprofloxacin (CIPRO) 250 MG tablet Take 1 tablet (250 mg total) by mouth 2 (two) times daily. 03/13/15   Erline Hau, MD  levothyroxine (SYNTHROID, LEVOTHROID) 50 MCG tablet Take 50 mcg by mouth daily.    Historical Provider, MD  LORazepam (ATIVAN) 0.5 MG tablet Take 0.25 mg by mouth every 4 (four) hours as needed for anxiety.     Historical Provider, MD  metoprolol succinate (TOPROL-XL) 50 MG 24  hr tablet Take 50 mg by mouth daily.     Historical Provider, MD  mirtazapine (REMERON) 15 MG tablet Take 15 mg by mouth at bedtime.    Historical Provider, MD  Multiple Vitamins-Minerals (I-VITE PO) Take 1 tablet by mouth daily.    Historical Provider, MD  pantoprazole (PROTONIX) 40 MG tablet Take 40 mg by mouth daily.    Historical Provider, MD  QUEtiapine Fumarate (SEROQUEL XR) 150 MG 24 hr tablet Take 150 mg by mouth at bedtime.    Historical Provider, MD  senna (SENOKOT) 8.6 MG TABS tablet Take 2 tablets by mouth at bedtime.     Historical Provider, MD  sertraline (ZOLOFT) 50 MG tablet Take 50 mg by mouth daily.    Historical Provider, MD  traMADol (ULTRAM) 50 MG tablet Take 1 tablet (50 mg total) by mouth every 6 (six) hours as needed for moderate pain. 03/08/15   Estela Leonie Green, MD  Vitamins A & D (BAZA CLEAR) OINT Apply 1 application topically 2 (two) times daily as needed.    Historical Provider, MD   BP 135/72 mmHg  Pulse 81  Temp(Src) 98.2 F (36.8 C) (Axillary)  Resp 18  Ht 5\' 7"  (1.702 m)  Wt 46.267 kg  BMI 15.97 kg/m2  SpO2 95% Physical Exam  Constitutional: She is oriented to person, place, and time. She appears well-developed and well-nourished.  Non-toxic appearance.  HENT:  Head: Normocephalic.  Right Ear: Tympanic membrane and external ear normal.  Left Ear: Tympanic membrane and external ear normal.  There is soreness to palpation of the posterior scalp. No noted broken skin areas.  No evidence of trauma to the tongue.  Eyes: EOM and lids are normal. Pupils are equal, round, and reactive to light.  Neck: Normal range of motion. Neck supple. Carotid bruit is not present.  Cardiovascular: Normal rate, regular rhythm, intact distal pulses and normal pulses.  Exam reveals no friction rub.   Murmur heard. Pulmonary/Chest: Breath sounds normal. No respiratory distress. She has no wheezes.  There is symmetrical rise and fall of the chest. There is no palpable  tenderness of the left or right chest wall.  Abdominal: Soft. Bowel sounds are normal. There is no tenderness. There is no guarding.  Musculoskeletal: Normal range of motion.  There is tenderness to palpation in the sacral/coccyx area. There is pain with attempted movement of the left hip. There is no visible deformity of the right or left femur, knee, tibial area. There is no shortening appreciated of the lower extremities.  Lymphadenopathy:       Head (right side): No submandibular adenopathy present.       Head (left side): No submandibular adenopathy present.    She has no cervical adenopathy.  Neurological: She is alert and oriented to person, place, and time. She has normal strength. No cranial nerve deficit or sensory  deficit.  Patient has a tremor of the right upper extremity. Grip is weak but symmetrical. No gross sensory deficits appreciated.  Skin: Skin is warm and dry.  Psychiatric: Her speech is normal. Her mood appears anxious.  Nursing note and vitals reviewed.   ED Course  Procedures (including critical care time) Labs Review Labs Reviewed  COMPREHENSIVE METABOLIC PANEL  TROPONIN I  CBC WITH DIFFERENTIAL/PLATELET  URINALYSIS, ROUTINE W REFLEX MICROSCOPIC (NOT AT [facility])    Imaging Review No results found. I have personally reviewed and evaluated these images and lab results as part of my medical decision-making.   EKG Interpretation   Date/Time:  Monday April 24 2015 08:21:08 EST Ventricular Rate:  74 PR Interval:    QRS Duration: 87 QT Interval:  465 QTC Calculation: 516 R Axis:   109 Text Interpretation:  Atrial fibrillation Ventricular premature complex  Anterolateral infarct, old Prolonged QT interval Confirmed by Jeneen Rinks  MD,  [place] ([id number]) on 04/24/2015 8:24:23 AM      MDM  Vital signs are well within normal limits. The urinalysis shows a cloudy yellow specimen with trace of leukocytes, and 6-30 white cells. There are many bacteria present. A culture  has been sent to the lab. Patient will be given Keflex here in the emergency department. The PT shows an INR of 3.46, a PT of 34.1. We will ask the nursing facility to hold the Coumadin for today, and recheck the INR levels on tomorrow. The EKG and troponin are negative for acute event. The complete blood count is negative. CT scan of the head and neck negative for acute bleed, skull fracture, or cervical fracture. X-ray of the left hip shows the comminuted fractures of the left. Pubic ramus with early callus formation. There are also noted stable osteoarthritic changes in both hip joints. X-ray of the sacral coccyx is negative for acute event.    Final diagnoses:  None    *I have reviewed nursing notes, vital signs, and all appropriate lab and imaging results for this patient.[address], PA-C 04/24/15 1034  Tanna Furry, MD 05/03/15 [phone]

## 2015-04-24 NOTE — ED Notes (Signed)
Report given to [place] at [facility]. They are sending transport for patient. Dictated specific MD instructions on INR/PT levels today and that coumadin is to be held today and rechecked tomorrow. Verbal understanding obtained from [place]. Awaiting patient's ride back to facility at this time.

## 2015-04-24 NOTE — Discharge Instructions (Signed)
Mrs. Gilford Rile CT head and neck scan are negative for fracture or bleeding. The x-ray of her hip, pelvis, and sacrum are negative for any new fracture. The old fracture of the pubic ramus is beginning to heal. The INR is elevated at 3.46. Please hold the Coumadin for today, and recheck the level on tomorrow. Please have her primary physician to follow the PT-INR closely. A culture of her urine has been sent to the lab. Facial or Scalp Contusion  A facial or scalp contusion is a deep bruise on the face or head. Contusions happen when an injury causes bleeding under the skin. Signs of bruising include pain, puffiness (swelling), and discolored skin. The contusion may turn blue, purple, or yellow. HOME CARE  Only take medicines as told by your doctor.  Put ice on the injured area.  Put ice in a plastic bag.  Place a towel between your skin and the bag.  Leave the ice on for 20 minutes, 2-3 times a day. GET HELP IF:  You have bite problems.  You have pain when chewing.  You are worried about your face not healing normally. GET HELP RIGHT AWAY IF:   You have severe pain or a headache and medicine does not help.  You are very tired or confused, or your personality changes.  You throw up (vomit).  You have a nosebleed that will not stop.  You see two of everything (double vision) or have blurry vision.  You have fluid coming from your nose or ear.  You have problems walking or using your arms or legs. MAKE SURE YOU:   Understand these instructions.  Will watch your condition.  Will get help right away if you are not doing well or get worse.   This information is not intended to replace advice given to you by your health care provider. Make sure you discuss any questions you have with your health care provider.   Document Released: 04/25/2011 Document Revised: 05/27/2014 Document Reviewed: 12/17/2012 Elsevier Interactive Patient Education [employer].

## 2015-04-25 ENCOUNTER — Other Ambulatory Visit (HOSPITAL_COMMUNITY)
Admission: AD | Admit: 2015-04-25 | Discharge: 2015-04-25 | Disposition: A | Discharge status: Home / Self Care | Payer: Medicare Other | Source: Other Acute Inpatient Hospital | Attending: Family Medicine | Admitting: Family Medicine

## 2015-04-25 DIAGNOSIS — I482 Chronic atrial fibrillation: Secondary | ICD-10-CM | POA: Insufficient documentation

## 2015-04-25 LAB — PROTIME-INR
INR: 2.34 — AB (ref 0.00–1.49)
PROTHROMBIN TIME: 25.4 s — AB (ref 11.6–15.2)

## 2015-04-26 LAB — URINE CULTURE

## 2015-05-02 ENCOUNTER — Other Ambulatory Visit (HOSPITAL_COMMUNITY)
Admission: RE | Admit: 2015-05-02 | Discharge: 2015-05-02 | Disposition: A | Discharge status: Home / Self Care | Payer: Medicare Other | Source: Other Acute Inpatient Hospital | Attending: Family Medicine | Admitting: Family Medicine

## 2015-05-02 DIAGNOSIS — I482 Chronic atrial fibrillation: Secondary | ICD-10-CM | POA: Diagnosis present

## 2015-05-02 DIAGNOSIS — Z7901 Long term (current) use of anticoagulants: Secondary | ICD-10-CM | POA: Insufficient documentation

## 2015-05-02 LAB — PROTIME-INR
INR: 1.18 (ref 0.00–1.49)
Prothrombin Time: 15.2 seconds (ref 11.6–15.2)

## 2015-05-16 ENCOUNTER — Telehealth: Payer: Self-pay | Admitting: Cardiology

## 2015-05-16 NOTE — Telephone Encounter (Signed)
per [place] she has moved to [facility]

## 2015-06-13 ENCOUNTER — Ambulatory Visit: Payer: Self-pay | Admitting: *Deleted

## 2015-06-13 DIAGNOSIS — Z5181 Encounter for therapeutic drug level monitoring: Secondary | ICD-10-CM

## 2015-06-13 DIAGNOSIS — I4821 Permanent atrial fibrillation: Secondary | ICD-10-CM

## 2015-06-29 ENCOUNTER — Ambulatory Visit: Payer: Medicare Other | Admitting: Cardiology

## 2017-03-03 IMAGING — CT CT ABD-PELV W/O CM
2 of 4 series · 16 of 46 positions shown, 18 images · non-contrast
Comparison: 02/04/2014

CLINICAL DATA: Bright red blood per rectum, history of recent
pelvic fracture

EXAM:
CT ABDOMEN AND PELVIS WITHOUT CONTRAST
TECHNIQUE: Multidetector CT imaging of the abdomen and pelvis was performed
following the standard protocol without IV contrast.

[Series 2: abdomen/pelvis w/o contrast · axial · non-contrast · 0.67mm/px · z∈[+430,+870]mm · 13 of 96 slices shown, 15 images]
[im 4/96  soft-tissue]
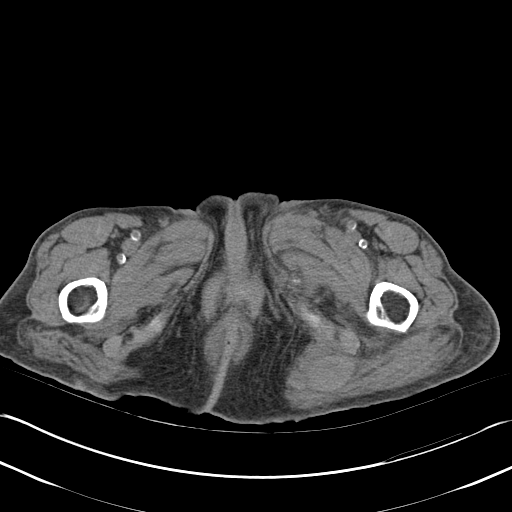
[im 4/96  bone]
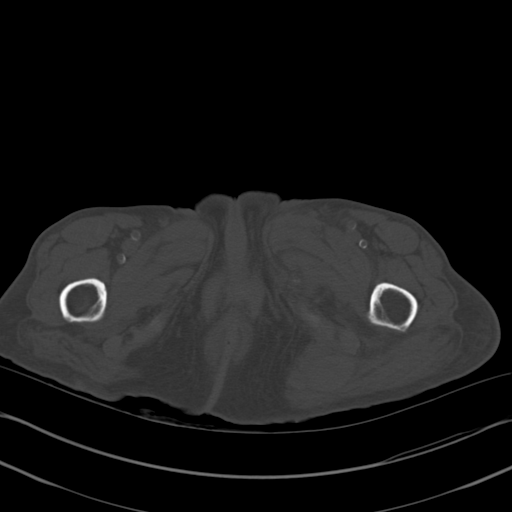
[im 12/96  soft-tissue]
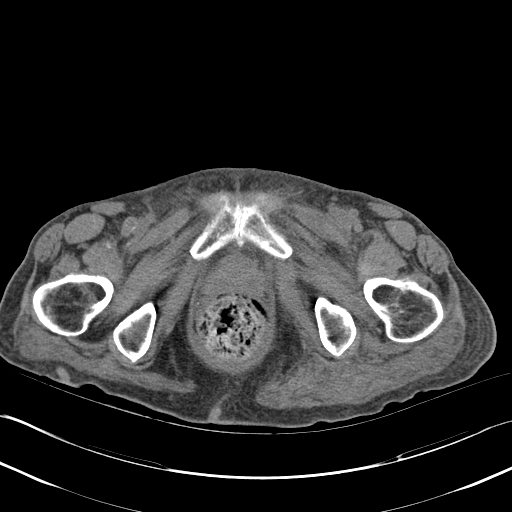
[im 20/96  soft-tissue]
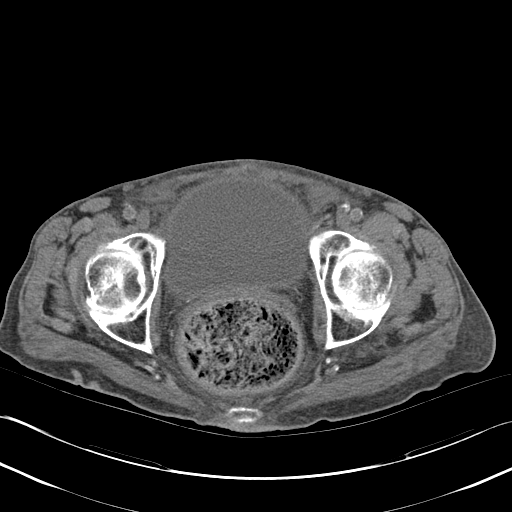
[im 27/96  soft-tissue]
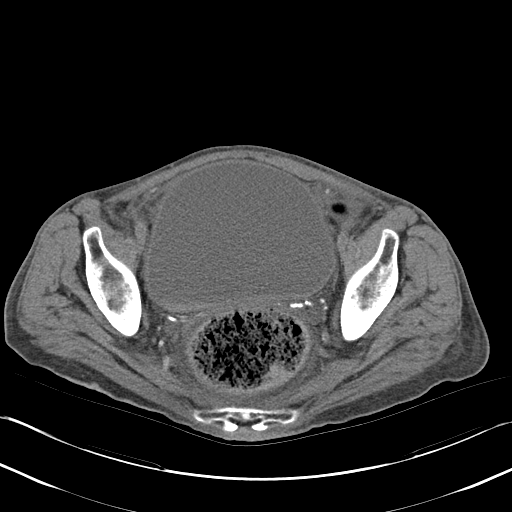
[im 35/96  soft-tissue]
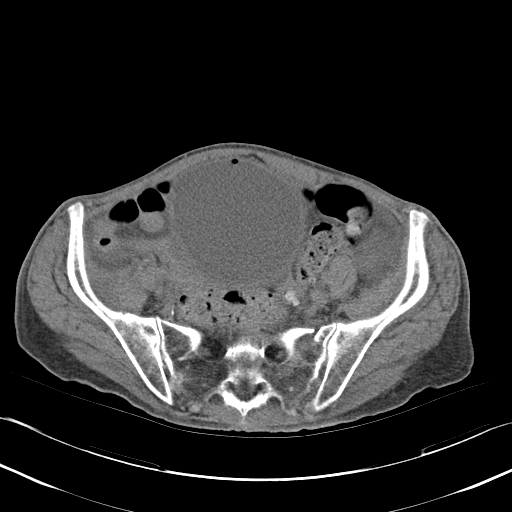
[im 42/96  soft-tissue]
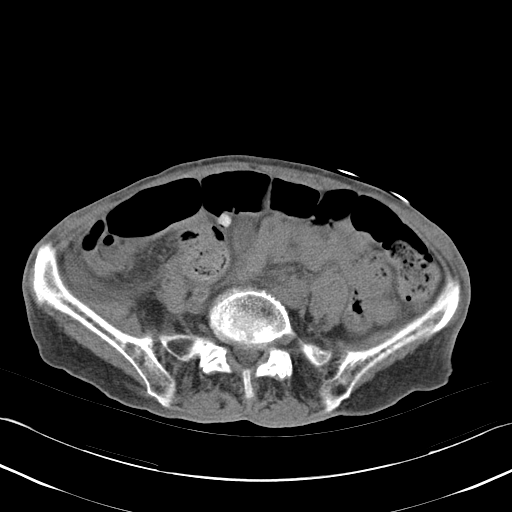
[im 50/96  soft-tissue]
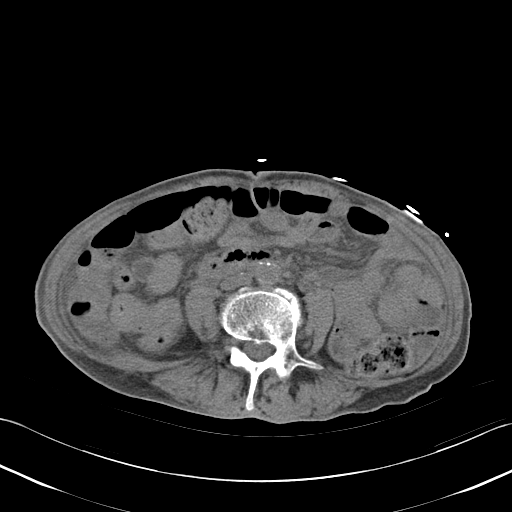
[im 54/96  soft-tissue]
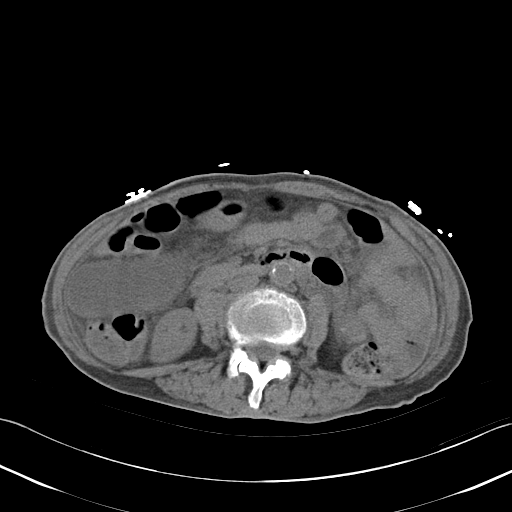
[im 61/96  soft-tissue]
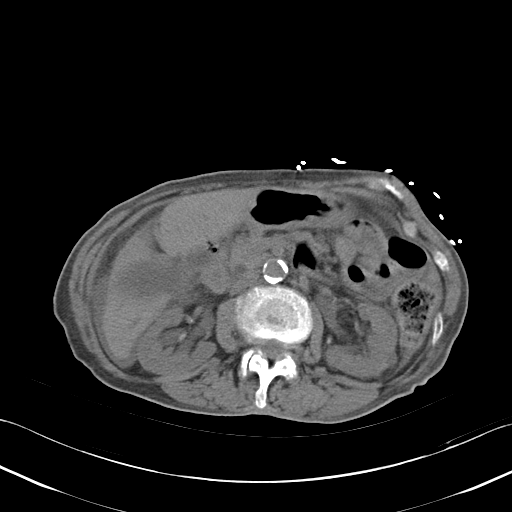
[im 61/96  bone]
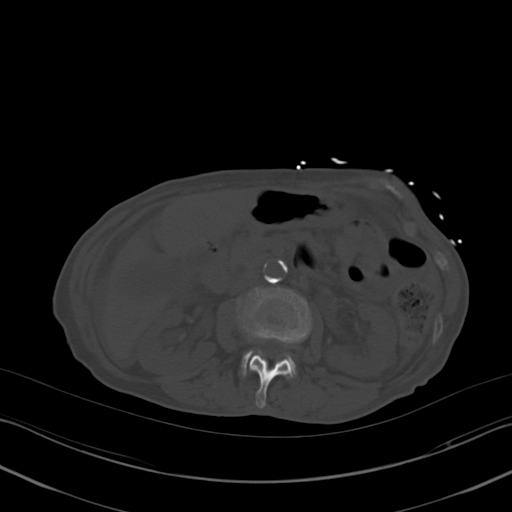
[im 69/96  soft-tissue]
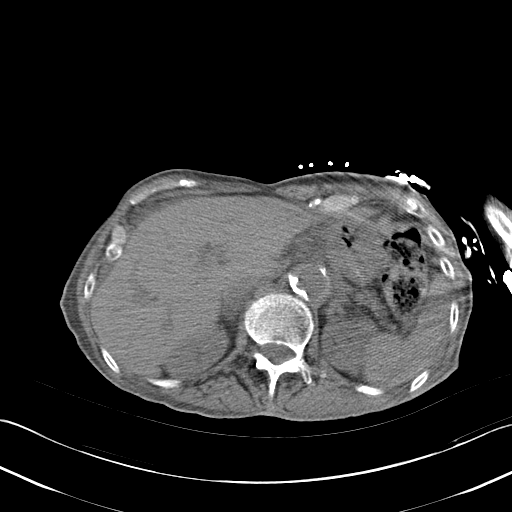
[im 77/96  soft-tissue]
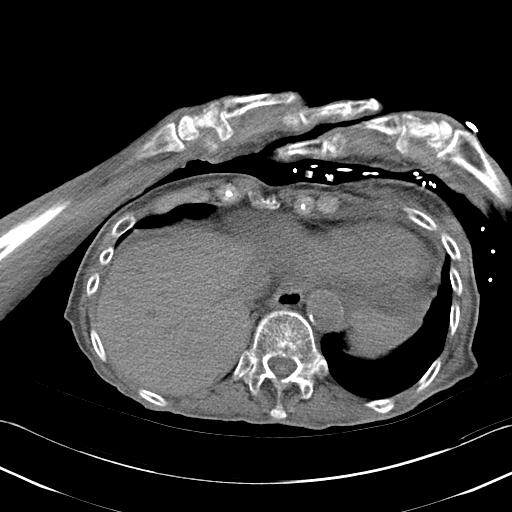
[im 84/96  soft-tissue]
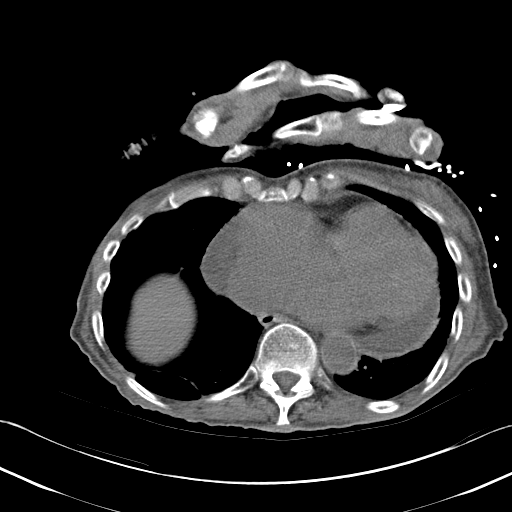
[im 92/96  soft-tissue]
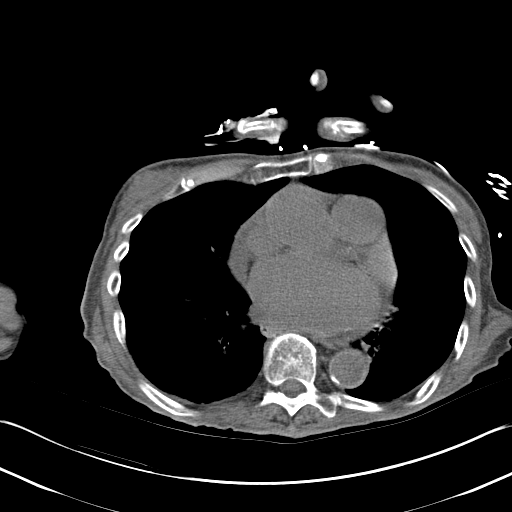

[Series 3: mpr cor 3.0mm · coronal · 0.69mm/px · 3 of 70 slices shown]
[im 24/70  soft-tissue]
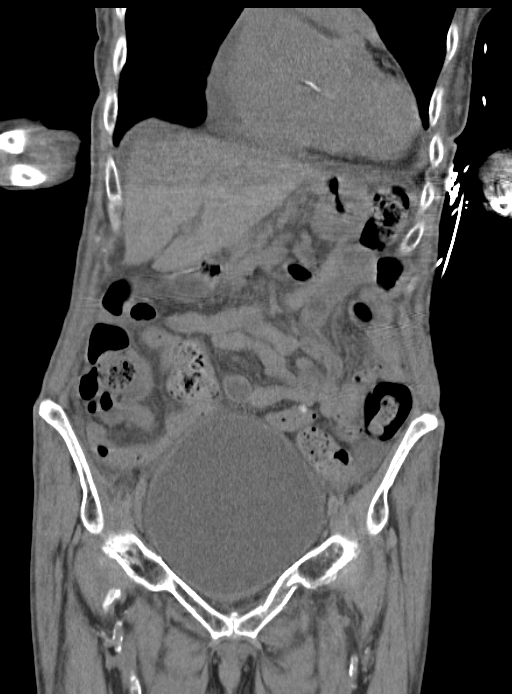
[im 31/70  soft-tissue]
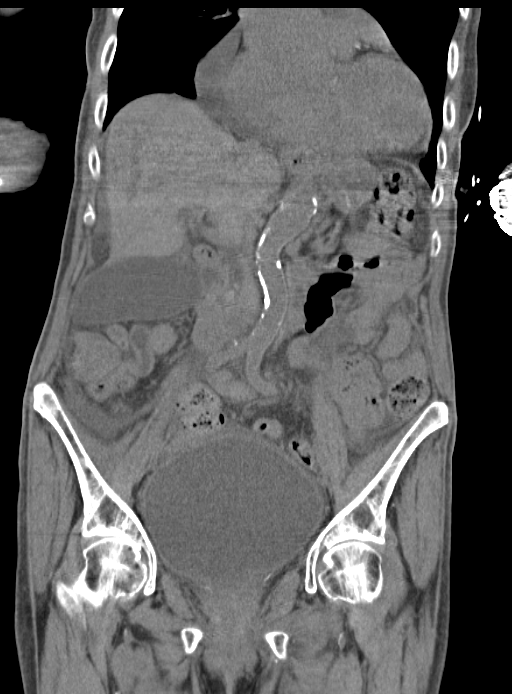
[im 39/70  soft-tissue]
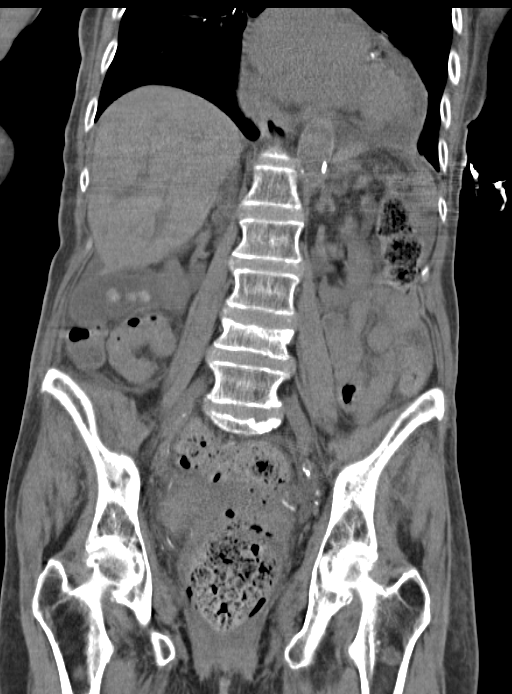

[16 of 46 positions shown; findings below may reference images not displayed]

FINDINGS: Lung bases are free of acute infiltrate or sizable effusion. The
cardiac shadow is enlarged. A pericardial effusion is noted
posteriorly as well as along the right lateral margin measuring
approximately 16 mm. This is stable from the prior exam.

The gallbladder is well distended and demonstrates multiple
gallstones. The liver, spleen, adrenal glands and pancreas are
within normal limits. In the region of the pancreatic head there is
a focus of increased density identified consistent with a distal
common bile duct stone. This does not appear to cause significant
biliary obstruction.

The kidneys are well visualized bilaterally with mild prominence of
the collecting systems and ureters. This may be related to an over
distended bladder as no definitive ureteral calculi are seen.

Aortoiliac calcifications are seen. Again the bladder is over
distended. Fecal material is noted throughout the colon. Patient's
known pubic rami fractures on the left are again noted. An
undisplaced left sacral fracture is also noted.
IMPRESSION: Cholelithiasis as well as choledocholithiasis. No significant
biliary ductal dilatation is seen.

Stable pericardial effusion.

Over distended bladder contributing to fullness in the collecting
systems bilaterally.

Left pubic rami fractures as well as an undisplaced left sacral
fracture which was not well appreciated on the prior plain film
examination.

## 2018-11-18 DEATH — deceased
